# Patient Record
Sex: Female | Born: 1990 | Race: White | Hispanic: No | Marital: Married | Smoking: Never smoker
Health system: Southern US, Community
[De-identification: ages and names within clinical notes are randomized; demographics above are authoritative.]

## PROBLEM LIST (undated history)

## (undated) DIAGNOSIS — G4700 Insomnia, unspecified: Secondary | ICD-10-CM

## (undated) DIAGNOSIS — R7989 Other specified abnormal findings of blood chemistry: Secondary | ICD-10-CM

## (undated) DIAGNOSIS — F411 Generalized anxiety disorder: Secondary | ICD-10-CM

## (undated) DIAGNOSIS — O24419 Gestational diabetes mellitus in pregnancy, unspecified control: Secondary | ICD-10-CM

## (undated) HISTORY — PX: NM RENAL LASIX (ARMC HX): HXRAD1213

## (undated) HISTORY — DX: Insomnia, unspecified: G47.00

## (undated) HISTORY — DX: Generalized anxiety disorder: F41.1

## (undated) HISTORY — PX: TONSILLECTOMY AND ADENOIDECTOMY: SHX28

## (undated) HISTORY — DX: Gestational diabetes mellitus in pregnancy, unspecified control: O24.419

## (undated) HISTORY — DX: Other specified abnormal findings of blood chemistry: R79.89

## (undated) HISTORY — PX: WISDOM TOOTH EXTRACTION: SHX21

---

## 2011-03-27 ENCOUNTER — Ambulatory Visit: Payer: Self-pay | Admitting: Family Medicine

## 2013-05-22 ENCOUNTER — Emergency Department: Payer: Self-pay | Admitting: Internal Medicine

## 2015-01-26 ENCOUNTER — Ambulatory Visit (INDEPENDENT_AMBULATORY_CARE_PROVIDER_SITE_OTHER): Payer: BLUE CROSS/BLUE SHIELD | Admitting: Family Medicine

## 2015-01-26 ENCOUNTER — Encounter: Payer: Self-pay | Admitting: Family Medicine

## 2015-01-26 VITALS — BP 120/81 | HR 90 | Temp 98.3°F | Wt 133.0 lb

## 2015-01-26 DIAGNOSIS — L723 Sebaceous cyst: Secondary | ICD-10-CM

## 2015-01-26 NOTE — Progress Notes (Signed)
   BP 120/81 mmHg  Pulse 90  Temp(Src) 98.3 F (36.8 C)  Wt 133 lb (60.328 kg)  SpO2 99%  LMP 01/16/2015 (Approximate)   Subjective:    Patient ID: Haley Bell, female    DOB: 11/08/1990, 24 y.o.   MRN: [id number]  HPI: Haley Bell is a 24 y.o. female  Chief Complaint  Patient presents with  . Cyst    upper left shoulder, been there over a year   It is this little place over the left shoulder on her back There for over a year Thought it was a stress place where she hurts from stress If you pull it up, it is like a marble She feels like she can squeeze stuff out of it but has not actually squeezed anything out Sometimes it will flare up; if she drives from school to home she can feel it throbbing back there  Relevant past medical, surgical, family and social history reviewed and updated as indicated. Interim medical history since our last visit reviewed. Allergies and medications reviewed and updated.  Review of Systems  Per HPI unless specifically indicated above     Objective:    BP 120/81 mmHg  Pulse 90  Temp(Src) 98.3 F (36.8 C)  Wt 133 lb (60.328 kg)  SpO2 99%  LMP 01/16/2015 (Approximate)  Wt Readings from Last 3 Encounters:  01/26/15 133 lb (60.328 kg)  07/10/13 135 lb (61.236 kg)    Physical Exam  Constitutional: She appears well-developed and well-nourished. No distress.  Eyes: EOM are normal. No scleral icterus.  Cardiovascular: Normal rate.   Pulmonary/Chest: Effort normal.  Abdominal: She exhibits no distension.  Skin: No pallor.  Over the posterior right shoulder / upper back, there is a flesh-colored swelling with central pore; no erythema; discrete margins  Psychiatric: She has a normal mood and affect. Her behavior is normal. Judgment and thought content normal.    No results found for this or any previous visit.    Assessment & Plan:   Problem List Items Addressed This Visit    None    Visit Diagnoses    Sebaceous cyst    -   Primary    explained diagnosis; options for treatment; she opted for I&D; after R/B/A discussed, area prepped, 1 cc lido w/epi, #11 blade, expressed sebaceous material      dressing applied; wound care instructions explained  after numbing, patient felt dizzy and was laid down; recovered well, felt fine at end of procedure  return if needed, but no specific f/u scheduled  Follow up plan: No Follow-up on file.

## 2015-01-26 NOTE — Patient Instructions (Signed)
Keep area covered and clean and dry today Tomorrow you can change the bandage and use neosporin or polysporin Keep it covered with a bandage when you shower for the next several days Please call if you notice any redness, yellow foul-smelling drainage, or run a fever You are welcome to return for ten days to wound checks or if you develop any problems free of charge; just call

## 2015-06-23 ENCOUNTER — Telehealth: Payer: Self-pay

## 2015-06-23 NOTE — Telephone Encounter (Signed)
Patient's mother Rosey Bath(Teresa) would like to know if patient has had a current Tetanus shot.  Please call patient's mother at [phone] to advise.

## 2015-06-27 NOTE — Telephone Encounter (Signed)
done

## 2015-07-20 ENCOUNTER — Ambulatory Visit (INDEPENDENT_AMBULATORY_CARE_PROVIDER_SITE_OTHER): Payer: BLUE CROSS/BLUE SHIELD | Admitting: Family Medicine

## 2015-07-20 ENCOUNTER — Encounter: Payer: Self-pay | Admitting: Family Medicine

## 2015-07-20 VITALS — BP 121/77 | HR 84 | Temp 98.5°F | Ht 66.75 in | Wt 131.0 lb

## 2015-07-20 DIAGNOSIS — Z0000 Encounter for general adult medical examination without abnormal findings: Secondary | ICD-10-CM

## 2015-07-20 DIAGNOSIS — F411 Generalized anxiety disorder: Secondary | ICD-10-CM | POA: Insufficient documentation

## 2015-07-20 DIAGNOSIS — G4700 Insomnia, unspecified: Secondary | ICD-10-CM | POA: Diagnosis not present

## 2015-07-20 HISTORY — DX: Insomnia, unspecified: G47.00

## 2015-07-20 HISTORY — DX: Generalized anxiety disorder: F41.1

## 2015-07-20 MED ORDER — SERTRALINE HCL 50 MG PO TABS: ORAL_TABLET | ORAL | Status: DC

## 2015-07-20 NOTE — Assessment & Plan Note (Signed)
Discussed sx, don't think this is ADHD or primary sleep disorder; believe this is anxiety d/o; will start sertraline; explained risk of developing mania or hypomania, reasons to call before next visit; she denies bipolar d/o in family; list of counselors given from [employer] site, showed her the site; encouraged her to start working with someone

## 2015-07-20 NOTE — Assessment & Plan Note (Signed)
See AVS for sleep recommendations; will have her try melatonin at exact same time of night for 3 weeks; if we can turn down the intrusive worry, I think she'll relax and sleep better; return in 3-4 weeks

## 2015-07-20 NOTE — Progress Notes (Signed)
BP 121/77 mmHg  Pulse 84  Temp(Src) 98.5 F (36.9 C)  Ht 5' 6.75" (1.695 m)  Wt 131 lb (59.421 kg)  BMI 20.68 kg/m2  SpO2 99%  LMP 06/26/2015 (Exact Date)   Subjective:    Patient ID: Haley Bell, female    DOB: 1990/06/28, 25 y.o.   MRN: [id number]  HPI: Haley Bell is a 25 y.o. female  Chief Complaint  Patient presents with  . Insomnia    having trouble falling sleeping at night, very restless   She feels like she has trouble falling asleep She is in an internship right now Not sleeping at night well She feels tired during the day She laid in bed all night and did not sleep Her brain is constantly thinking of everything she has to get down Her thoughts go so fast, can't concentrate on anything else Can't just sit down and watch TV, because she thinks about everything she has to do Anxious about getting stuff down, stomach is in knots or a ball Sometimes she lays in bed and her heart is beating faster She has been looking up things on-line to see if she can change anything Tries to take deep breaths Started drinking cucumber water, but still feels stressed Worries about everything and can't stop Worries about doing her work I asked if it is new; she says it has always been like this to some degree, but getting worse 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 She took a sleeping pill, advil PM, but didn't help her sleep any better She doesn't want to take something if needed Can't concentrate and can't just think about one thing Weight is stable; she does exercise three times a week; little better after she exercises This goes in waves; then might have a day or two that is better She does drink coffee or soda; drinks water and OJ, just sweet tea if out to eat Does not take any decongestants Coffee or something like that makes her heart race, makes it 10x worse No hallucinations No depression She is a happy person, but hard to focus on anything else when she  gets something on her mind; can never be calm; stomach will hurt if she thinks about stuff too much No loose stools No recent labs  Relevant past medical, surgical, family and social history reviewed and updated as indicated History reviewed. No pertinent past medical history.  Past Surgical History  Procedure Laterality Date  . Tonsillectomy and adenoidectomy    . Wisdom tooth extraction     Interim medical history since our last visit reviewed. Family History  Problem Relation Age of Onset  . Ovarian cysts Mother   . Diabetes Maternal Grandmother   . Hypertension Maternal Grandmother   no known anxiety disorder in the family, but mother does get worried about stuff  Allergies and medications reviewed and updated.  Review of Systems Per HPI unless specifically indicated above     Objective:    BP 121/77 mmHg  Pulse 84  Temp(Src) 98.5 F (36.9 C)  Ht 5' 6.75" (1.695 m)  Wt 131 lb (59.421 kg)  BMI 20.68 kg/m2  SpO2 99%  LMP 06/26/2015 (Exact Date)  Wt Readings from Last 3 Encounters:  07/20/15 131 lb (59.421 kg)  01/26/15 133 lb (60.328 kg)  07/10/13 135 lb (61.236 kg)    Physical Exam  Constitutional: She appears well-developed and well-nourished. No distress.  HENT:  Head: Normocephalic and atraumatic.  Eyes:  EOM are normal. No scleral icterus.  Neck: No thyromegaly present.  Cardiovascular: Normal rate, regular rhythm and normal heart sounds.   No murmur heard. Pulmonary/Chest: Effort normal and breath sounds normal. No respiratory distress. She has no wheezes.  Abdominal: Soft. Bowel sounds are normal. She exhibits no distension.  Musculoskeletal: Normal range of motion. She exhibits no edema.  Neurological: She is alert. She exhibits normal muscle tone.  Reflex Scores:      Patellar reflexes are 2+ on the right side and 2+ on the left side. Skin: Skin is warm and dry. She is not diaphoretic. No pallor.  Psychiatric: Judgment and thought content normal. Her  mood appears anxious. Her speech is not tangential. She is hyperactive. She is not agitated, not aggressive, not actively hallucinating and not combative. She does not exhibit a depressed mood.  Talks fast and high energy, but redirectable, not manic She is attentive.   No results found for this or any previous visit.    Assessment & Plan:   Problem List Items Addressed This Visit      Other   Preventative health care    Check yearly labs; complete physical not done      Relevant Orders   Lipid Panel w/o Chol/HDL Ratio   TSH   Comprehensive metabolic panel   GAD (generalized anxiety disorder) - Primary    Discussed sx, don't think this is ADHD or primary sleep disorder; believe this is anxiety d/o; will start sertraline; explained risk of developing mania or hypomania, reasons to call before next visit; she denies bipolar d/o in family; list of counselors given from [employer] site, showed her the site; encouraged her to start working with someone      Insomnia    See AVS for sleep recommendations; will have her try melatonin at exact same time of night for 3 weeks; if we can turn down the intrusive worry, I think she'll relax and sleep better; return in 3-4 weeks         Follow up plan: Return 3-4 weeks, for medication follow-up.  Face-to-face time with patient was more than 25 minutes, >50% time spent counseling and coordination of care  An after-visit summary was printed and given to the patient at check-out.  Please see the patient instructions which may contain other information and recommendations beyond what is mentioned above in the assessment and plan.  Meds ordered this encounter  Medications  . sertraline (ZOLOFT) 50 MG tablet    Sig: Take half of a pill every morning for four days, then one whole pill daily    Dispense:  28 tablet    Refill:  0   Orders Placed This Encounter  Procedures  . Lipid Panel w/o Chol/HDL Ratio  . TSH  . Comprehensive metabolic  panel

## 2015-07-20 NOTE — Assessment & Plan Note (Signed)
Check yearly labs; complete physical not done

## 2015-07-20 NOTE — Patient Instructions (Addendum)
Try melatonin 3 mg at the same time of night for 3 weeks Start the new medicine for anxiety Avoid caffeine Do start to work with a counselor about strategies that will help you deal with your busy life Check out therapists at [facility] Return in 3-4 weeks for follow-up Call sooner if any problems  Insomnia Insomnia is a sleep disorder that makes it difficult to fall asleep or to stay asleep. Insomnia can cause tiredness (fatigue), low energy, difficulty concentrating, mood swings, and poor performance at work or school.  There are three different ways to classify insomnia:  Difficulty falling asleep.  Difficulty staying asleep.  Waking up too early in the morning. Any type of insomnia can be long-term (chronic) or short-term (acute). Both are common. Short-term insomnia usually lasts for three months or less. Chronic insomnia occurs at least three times a week for longer than three months. CAUSES  Insomnia may be caused by another condition, situation, or substance, such as:  Anxiety.  Certain medicines.  Gastroesophageal reflux disease (GERD) or other gastrointestinal conditions.  Asthma or other breathing conditions.  Restless legs syndrome, sleep apnea, or other sleep disorders.  Chronic pain.  Menopause. This may include hot flashes.  Stroke.  Abuse of alcohol, tobacco, or illegal drugs.  Depression.  Caffeine.   Neurological disorders, such as Alzheimer disease.  An overactive thyroid (hyperthyroidism). The cause of insomnia may not be known. RISK FACTORS Risk factors for insomnia include:  Gender. Women are more commonly affected than men.  Age. Insomnia is more common as you get older.  Stress. This may involve your professional or personal life.  Income. Insomnia is more common in people with lower income.  Lack of exercise.   Irregular work schedule or night shifts.  Traveling between different time zones. SIGNS AND SYMPTOMS If you  have insomnia, trouble falling asleep or trouble staying asleep is the main symptom. This may lead to other symptoms, such as:  Feeling fatigued.  Feeling nervous about going to sleep.  Not feeling rested in the morning.  Having trouble concentrating.  Feeling irritable, anxious, or depressed. TREATMENT  Treatment for insomnia depends on the cause. If your insomnia is caused by an underlying condition, treatment will focus on addressing the condition. Treatment may also include:   Medicines to help you sleep.  Counseling or therapy.  Lifestyle adjustments. HOME CARE INSTRUCTIONS   Take medicines only as directed by your health care provider.  Keep regular sleeping and waking hours. Avoid naps.  Keep a sleep diary to help you and your health care provider figure out what could be causing your insomnia. Include:   When you sleep.  When you wake up during the night.  How well you sleep.   How rested you feel the next day.  Any side effects of medicines you are taking.  What you eat and drink.   Make your bedroom a comfortable place where it is easy to fall asleep:  Put up shades or special blackout curtains to block light from outside.  Use a white noise machine to block noise.  Keep the temperature cool.   Exercise regularly as directed by your health care provider. Avoid exercising right before bedtime.  Use relaxation techniques to manage stress. Ask your health care provider to suggest some techniques that may work well for you. These may include:  Breathing exercises.  Routines to release muscle tension.  Visualizing peaceful scenes.  Cut back on alcohol, caffeinated beverages, and cigarettes, especially close  to bedtime. These can disrupt your sleep.  Do not overeat or eat spicy foods right before bedtime. This can lead to digestive discomfort that can make it hard for you to sleep.  Limit screen use before bedtime. This includes:  Watching  TV.  Using your smartphone, tablet, and computer.  Stick to a routine. This can help you fall asleep faster. Try to do a quiet activity, brush your teeth, and go to bed at the same time each night.  Get out of bed if you are still awake after 15 minutes of trying to sleep. Keep the lights down, but try reading or doing a quiet activity. When you feel sleepy, go back to bed.  Make sure that you drive carefully. Avoid driving if you feel very sleepy.  Keep all follow-up appointments as directed by your health care provider. This is important. SEEK MEDICAL CARE IF:   You are tired throughout the day or have trouble in your daily routine due to sleepiness.  You continue to have sleep problems or your sleep problems get worse. SEEK IMMEDIATE MEDICAL CARE IF:   You have serious thoughts about hurting yourself or someone else.   This information is not intended to replace advice given to you by your health care provider. Make sure you discuss any questions you have with your health care provider.   Document Released: 06/01/2000 Document Revised: 02/23/2015 Document Reviewed: 03/05/2014 Elsevier Interactive Patient Education 2016 [employer] Inc[employer] Generalized Anxiety Disorder Generalized anxiety disorder (GAD) is a mental disorder. It interferes with life functions, including relationships, work, and school. GAD is different from normal anxiety, which everyone experiences at some point in their lives in response to specific life events and activities. Normal anxiety actually helps [place] prepare for and get through these life events and activities. Normal anxiety goes away after the event or activity is over.  GAD causes anxiety that is not necessarily related to specific events or activities. It also causes excess anxiety in proportion to specific events or activities. The anxiety associated with GAD is also difficult to control. GAD can vary from mild to severe. People with severe GAD can have intense  waves of anxiety with physical symptoms (panic attacks).  SYMPTOMS The anxiety and worry associated with GAD are difficult to control. This anxiety and worry are related to many life events and activities and also occur more days than not for 6 months or longer. People with GAD also have three or more of the following symptoms (one or more in children):  Restlessness.   Fatigue.  Difficulty concentrating.   Irritability.  Muscle tension.  Difficulty sleeping or unsatisfying sleep. DIAGNOSIS GAD is diagnosed through an assessment by your health care provider. Your health care provider will ask you questions aboutyour mood,physical symptoms, and events in your life. Your health care provider may ask you about your medical history and use of alcohol or drugs, including prescription medicines. Your health care provider may also do a physical exam and blood tests. Certain medical conditions and the use of certain substances can cause symptoms similar to those associated with GAD. Your health care provider may refer you to a mental health specialist for further evaluation. TREATMENT The following therapies are usually used to treat GAD:   Medication. Antidepressant medication usually is prescribed for long-term daily control. Antianxiety medicines may be added in severe cases, especially when panic attacks occur.   Talk therapy (psychotherapy). Certain types of talk therapy can be helpful in treating GAD by providing  support, education, and guidance. A form of talk therapy called cognitive behavioral therapy can teach you healthy ways to think about and react to daily life events and activities.  Stress managementtechniques. These include yoga, meditation, and exercise and can be very helpful when they are practiced regularly. A mental health specialist can help determine which treatment is best for you. Some people see improvement with one therapy. However, other people require a  combination of therapies.   This information is not intended to replace advice given to you by your health care provider. Make sure you discuss any questions you have with your health care provider.   Document Released: 09/29/2012 Document Revised: 06/25/2014 Document Reviewed: 09/29/2012 Elsevier Interactive Patient Education [employer]

## 2015-07-21 LAB — COMPREHENSIVE METABOLIC PANEL
ALK PHOS: 54 IU/L (ref 39–117)
ALT: 10 IU/L (ref 0–32)
AST: 12 IU/L (ref 0–40)
Albumin/Globulin Ratio: 1.7 (ref 1.1–2.5)
Albumin: 4.5 g/dL (ref 3.5–5.5)
BUN/Creatinine Ratio: 12 (ref 8–20)
BUN: 9 mg/dL (ref 6–20)
Bilirubin Total: <0.2 mg/dL (ref 0.0–1.2)
CO2: 23 mmol/L (ref 18–29)
CREATININE: 0.74 mg/dL (ref 0.57–1.00)
Calcium: 9.8 mg/dL (ref 8.7–10.2)
Chloride: 103 mmol/L (ref 96–106)
GFR calc Af Amer: 132 mL/min/{1.73_m2} (ref >59)
GFR calc non Af Amer: 115 mL/min/{1.73_m2} (ref >59)
GLOBULIN, TOTAL: 2.6 g/dL (ref 1.5–4.5)
Glucose: 85 mg/dL (ref 65–99)
Potassium: 4.5 mmol/L (ref 3.5–5.2)
SODIUM: 140 mmol/L (ref 134–144)
Total Protein: 7.1 g/dL (ref 6.0–8.5)

## 2015-07-21 LAB — LIPID PANEL W/O CHOL/HDL RATIO
Cholesterol, Total: 157 mg/dL (ref 100–199)
HDL: 67 mg/dL (ref >39)
LDL CALC: 78 mg/dL (ref 0–99)
Triglycerides: 58 mg/dL (ref 0–149)
VLDL CHOLESTEROL CAL: 12 mg/dL (ref 5–40)

## 2015-07-21 LAB — TSH: TSH: 5.33 u[IU]/mL — AB (ref 0.450–4.500)

## 2015-07-27 ENCOUNTER — Telehealth: Payer: Self-pay | Admitting: Family Medicine

## 2015-07-27 DIAGNOSIS — R7989 Other specified abnormal findings of blood chemistry: Secondary | ICD-10-CM

## 2015-07-27 NOTE — Telephone Encounter (Signed)
TSH a little high; other labs okay; I called, left msg

## 2015-07-28 DIAGNOSIS — R7989 Other specified abnormal findings of blood chemistry: Secondary | ICD-10-CM | POA: Insufficient documentation

## 2015-07-28 HISTORY — DX: Other specified abnormal findings of blood chemistry: R79.89

## 2015-07-28 NOTE — Telephone Encounter (Signed)
Discussed lab results; no problems with s/s of hypothyroidism; no known fam hx of thyroid trouble Recheck in 3 months, but call sooner if issues Other labs look great

## 2015-07-28 NOTE — Telephone Encounter (Signed)
Please call patient on her cell phone [phone]

## 2015-08-11 ENCOUNTER — Ambulatory Visit: Payer: BLUE CROSS/BLUE SHIELD | Admitting: Family Medicine

## 2015-08-24 ENCOUNTER — Other Ambulatory Visit: Payer: Self-pay | Admitting: Family Medicine

## 2015-08-24 NOTE — Telephone Encounter (Signed)
Routing to provider  

## 2015-08-24 NOTE — Telephone Encounter (Signed)
pts mom called and stated that anxiety medication was working well and she would like to have another month supply [facility] [address]

## 2015-08-25 MED ORDER — SERTRALINE HCL 50 MG PO TABS: 50.0000 mg | ORAL_TABLET | Freq: Every day | ORAL | Status: DC

## 2015-08-25 NOTE — Telephone Encounter (Signed)
Patient was supposed to be seen 3-4 weeks after starting this I called patient personally; she says it is working well; her stomach doesn't get tight any more; not worrying as much as night; can just fall asleep No negative side effects, no problems; no mania or hypomania Six month supply approved, Rx sent; call with any problems

## 2016-02-21 ENCOUNTER — Encounter: Payer: Self-pay | Admitting: Family Medicine

## 2016-02-21 ENCOUNTER — Ambulatory Visit (INDEPENDENT_AMBULATORY_CARE_PROVIDER_SITE_OTHER): Payer: BLUE CROSS/BLUE SHIELD | Admitting: Family Medicine

## 2016-02-21 VITALS — BP 110/78 | HR 93 | Temp 98.1°F | Wt 138.0 lb

## 2016-02-21 DIAGNOSIS — R946 Abnormal results of thyroid function studies: Secondary | ICD-10-CM | POA: Diagnosis not present

## 2016-02-21 DIAGNOSIS — R7989 Other specified abnormal findings of blood chemistry: Secondary | ICD-10-CM

## 2016-02-21 DIAGNOSIS — R5383 Other fatigue: Secondary | ICD-10-CM | POA: Diagnosis not present

## 2016-02-21 DIAGNOSIS — F411 Generalized anxiety disorder: Secondary | ICD-10-CM

## 2016-02-21 LAB — CBC WITH DIFFERENTIAL/PLATELET
BASOS ABS: 0 {cells}/uL (ref 0–200)
Basophils Relative: 0 %
Eosinophils Absolute: 132 cells/uL (ref 15–500)
Eosinophils Relative: 2 %
HCT: 35.3 % (ref 35.0–45.0)
HEMOGLOBIN: 11.7 g/dL (ref 11.7–15.5)
Lymphocytes Relative: 32 %
Lymphs Abs: 2112 cells/uL (ref 850–3900)
MCH: 33.1 pg — AB (ref 27.0–33.0)
MCHC: 33.1 g/dL (ref 32.0–36.0)
MCV: 100 fL (ref 80.0–100.0)
MONO ABS: 660 {cells}/uL (ref 200–950)
MPV: 10.7 fL (ref 7.5–12.5)
Monocytes Relative: 10 %
NEUTROS PCT: 56 %
Neutro Abs: 3696 cells/uL (ref 1500–7800)
Platelets: 240 10*3/uL (ref 140–400)
RBC: 3.53 MIL/uL — ABNORMAL LOW (ref 3.80–5.10)
RDW: 12.3 % (ref 11.0–15.0)
WBC: 6.6 10*3/uL (ref 3.8–10.8)

## 2016-02-21 MED ORDER — HYDROXYZINE PAMOATE 25 MG PO CAPS: 25.0000 mg | ORAL_CAPSULE | Freq: Four times a day (QID) | ORAL | 0 refills | Status: DC | PRN

## 2016-02-21 NOTE — Assessment & Plan Note (Signed)
Discussed relaxation response; see after visit summary; encouraged her to try this technique; will have her use hydroxyzine pamoate prn, but do not drive if it makes her sleepy; will likely be best the night before; not addictive; I would rather use this than a benzo

## 2016-02-21 NOTE — Assessment & Plan Note (Signed)
Check CBC and TSH today.  

## 2016-02-21 NOTE — Patient Instructions (Addendum)
Let's get labs   Use the new medicine if needed for anxiety and sleep, but don't take and drive if it makes you sleepy  12 Ways to Curb Anxiety  ?Anxiety is normal human sensation. It is what helped our ancestors survive the pitfalls of the wilderness. Anxiety is defined as experiencing worry or nervousness about an imminent event or something with an uncertain outcome. It is a feeling experienced by most people at some point in their lives. Anxiety can be triggered by a very personal issue, such as the illness of a loved one, or an event of global proportions, such as a refugee crisis. Some of the symptoms of anxiety are:  Feeling restless.  Having a feeling of impending danger.  Increased heart rate.  Rapid breathing. Sweating.  Shaking.  Weakness or feeling tired.  Difficulty concentrating on anything except the current worry.  Insomnia.  Stomach or bowel problems. What can we do about anxiety we may be feeling? There are many techniques to help manage stress and relax. Here are 12 ways you can reduce your anxiety almost immediately: 1. Turn off the constant feed of information. Take a social media sabbatical. Studies have shown that social media directly contributes to social anxiety.  2. Monitor your television viewing habits. Are you watching shows that are also contributing to your anxiety, such as 24-hour news stations? Try watching something else, or better yet, nothing at all. Instead, listen to music, read an inspirational book or practice a hobby. 3. Eat nutritious meals. Also, don't skip meals and keep healthful snacks on hand. Hunger and poor diet contributes to feeling anxious. 4. Sleep. Sleeping on a regular schedule for at least seven to eight hours a night will do wonders for your outlook when you are awake. 5. Exercise. Regular exercise will help rid your body of that anxious energy and help you get more restful sleep. 6. Try deep (diaphragmatic) breathing. Inhale slowly  through your nose for five seconds and exhale through your mouth. 7. Practice acceptance and gratitude. When anxiety hits, accept that there are things out of your control that shouldn't be of immediate concern.  8. Seek out humor. When anxiety strikes, watch a funny video, read jokes or call a friend who makes you laugh. Laughter is healing for our bodies and releases endorphins that are calming. 9. Stay positive. Take the effort to replace negative thoughts with positive ones. Try to see a stressful situation in a positive light. Try to come up with solutions rather than dwelling on the problem. 10. Figure out what triggers your anxiety. Keep a journal and make note of anxious moments and the events surrounding them. This will help you identify triggers you can avoid or even eliminate. 11. Talk to someone. Let a trusted friend, family member or even trained professional know that you are feeling overwhelmed and anxious. Verbalize what you are feeling and why.  12. Volunteer. If your anxiety is triggered by a crisis on a large scale, become an advocate and work to resolve the problem that is causing you unease. Anxiety is often unwelcome and can become overwhelming. If not kept in check, it can become a disorder that could require medical treatment. However, if you take the time to care for yourself and avoid the triggers that make you anxious, you will be able to find moments of relaxation and clarity that make your life much more enjoyable.    Steps to Elicit the Relaxation Response The following is the technique  reprinted with permission from Dr. Billy Fischer book The Relaxation Response pages 162-163 1. Sit quietly in a comfortable position. 2. Close your eyes. 3. Deeply relax all your muscles,  beginning at your feet and progressing up to your face.  Keep them relaxed. 4. Breathe through your nose.  Become aware of your breathing.  As you breathe out, say the word, "one"*,  silently  to yourself. For example,  breathe in ... out, "one",- in .. out, "one", etc.  Breathe easily and naturally. 5. Continue for 10 to 20 minutes.  You may open your eyes to check the time, but do not use an alarm.  When you finish, sit quietly for several minutes,  at first with your eyes closed and later with your eyes opened.  Do not stand up for a few minutes. 6. Do not worry about whether you are successful  in achieving a deep level of relaxation.  Maintain a passive attitude and permit relaxation to occur at its own pace.  When distracting thoughts occur,  try to ignore them by not dwelling upon them  and return to repeating "one."  With practice, the response should come with little effort.  Practice the technique once or twice daily,  but not within two hours after any meal,  since the digestive processes seem to interfere with  the elicitation of the Relaxation Response. * It is better to use a soothing, mellifluous sound, preferably with no meaning. or association, to avoid stimulation of unnecessary thoughts - a mantra.

## 2016-02-21 NOTE — Assessment & Plan Note (Signed)
Check TSH again

## 2016-02-21 NOTE — Progress Notes (Signed)
BP 110/78   Pulse 93   Temp 98.1 F (36.7 C)   Wt 138 lb (62.6 kg)   LMP 02/05/2016   SpO2 98%   BMI 21.78 kg/m    Subjective:    Patient ID: Haley Bell, female    DOB: [birth year], 25 y.o.   MRN: [id number]  HPI: Haley Bell is a 25 y.o. female  Chief Complaint  Patient presents with  . 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 She was wondering is there is any type of anxiety medicine to take as needed Doesn't really want to take something every day; she won't be able to sleep Three weeks straight  Once she gets up there and teaching she'll be fine; it's the mental thing the night before; lays in bed the night before worrying about everything she has to do; gets nervous about the video, would rather have the principal sit in than be video taped Once she is up there and teaching she is okay Not having pounding in the chest, no rapid heartbeat No dry mouth; no fear of crowds She does have trouble falling asleep the night before; she had not had trouble lately, knows how she used to get; would get no sleep last semester when this happened She tried going to bed earlier; a roommate was in nursing school and talked with her about breathing exercises; she tries to not look at the phone or TV before going to the bed  She had labs checked in February; TSH was mildly abnormal at 5.330 She is interested in rechecking labs; gained just a few pounds; constipated; tries eating better, still constipated; no dry skin, no hair changes; feeling some fatigue during the day; taking chewable vitamins  Depression screen PHQ 2/9 02/21/2016  Decreased Interest 0  Down, Depressed, Hopeless 0  PHQ - 2  Score 0   Relevant past medical, surgical, family and social history reviewed No past medical history on file.  MD note: abnormal thyroid test; OCP use  Past Surgical History:  Procedure Laterality Date  . TONSILLECTOMY AND ADENOIDECTOMY    . WISDOM TOOTH EXTRACTION     Family History  Problem Relation Age of Onset  . Ovarian cysts Mother   . Diabetes Maternal Grandmother   . Hypertension Maternal Grandmother   MD note: grandmother has thyroid disease  Social History  Substance Use Topics  . Smoking status: Never Smoker  . Smokeless tobacco: Never Used  . Alcohol use No   Interim medical history since last visit reviewed. Allergies and medications reviewed  Review of Systems Per HPI unless specifically indicated above     Objective:    BP 110/78   Pulse 93   Temp 98.1 F (36.7 C)   Wt 138 lb (62.6 kg)   LMP 02/05/2016   SpO2 98%   BMI 21.78 kg/m   Wt Readings from Last 3 Encounters:  02/21/16 138 lb (62.6 kg)  07/20/15 131 lb (59.4 kg)  01/26/15 133 lb (60.3 kg)    Physical Exam  Constitutional: She appears well-developed and well-nourished. No distress.  Weight gain 7 pounds over last 7 months  Eyes: EOM are normal. No scleral icterus.  Neck: No thyromegaly present.  Cardiovascular: Normal rate and regular rhythm.   Pulmonary/Chest: Effort normal and breath sounds normal.  Neurological: She is alert.  Reflex Scores:      Patellar reflexes are 1+ on the right side and 1+ on the left side. Skin: No pallor.  Psychiatric: She has a normal mood and affect. Her mood appears not anxious.  Very polite and cooperative   Results for orders placed or performed in visit on 07/20/15  Lipid Panel w/o Chol/HDL Ratio  Result Value Ref Range   Cholesterol, Total 157 100 - 199 mg/dL   Triglycerides 58 0 - 149 mg/dL   HDL 67 >13 mg/dL   VLDL Cholesterol Cal 12 5 - 40 mg/dL   LDL Calculated 78 0 - 99 mg/dL  TSH  Result Value Ref Range   TSH 5.330 (H) 0.450 - 4.500  uIU/mL  Comprehensive metabolic panel  Result Value Ref Range   Glucose 85 65 - 99 mg/dL   BUN 9 6 - 20 mg/dL   Creatinine, Ser 2.44 0.57 - 1.00 mg/dL   GFR calc non Af Amer 115 >59 mL/min/1.73   GFR calc Af Amer 132 >59 mL/min/1.73   BUN/Creatinine Ratio 12 8 - 20   Sodium 140 134 - 144 mmol/L   Potassium 4.5 3.5 - 5.2 mmol/L   Chloride 103 96 - 106 mmol/L   CO2 23 18 - 29 mmol/L   Calcium 9.8 8.7 - 10.2 mg/dL   Total Protein 7.1 6.0 - 8.5 g/dL   Albumin 4.5 3.5 - 5.5 g/dL   Globulin, Total 2.6 1.5 - 4.5 g/dL   Albumin/Globulin Ratio 1.7 1.1 - 2.5   Bilirubin Total <0.2 0.0 - 1.2 mg/dL   Alkaline Phosphatase 54 39 - 117 IU/L   AST 12 0 - 40 IU/L   ALT 10 0 - 32 IU/L      Assessment & Plan:   Problem List Items Addressed This Visit      Other   GAD (generalized anxiety disorder) - Primary    Discussed relaxation response; see after visit summary; encouraged her to try this technique; will have her use hydroxyzine pamoate prn, but do not drive if it makes her sleepy; will likely be best the night before; not addictive; I would rather use this than a benzo      Fatigue    Check CBC and TSH today      Relevant Orders   CBC with Differential/Platelet   TSH   Abnormal thyroid blood test    Check TSH again      Relevant Orders   TSH    Other Visit Diagnoses   None.      Follow up plan: Return if symptoms worsen or fail to improve.  An after-visit summary was printed and given to the patient at check-out.  Please see the patient instructions which may contain other information and recommendations beyond what is mentioned above in the assessment and plan.  Meds ordered this encounter  Medications  . hydrOXYzine (VISTARIL) 25 MG capsule    Sig: Take 1 capsule (25 mg total) by mouth every 6 (six) hours as needed for anxiety.    Dispense:  30 capsule    Refill:  0    Orders Placed This Encounter  Procedures  . CBC with Differential/Platelet  . TSH

## 2016-02-22 LAB — TSH: TSH: 2.97 mIU/L

## 2016-08-16 ENCOUNTER — Ambulatory Visit: Payer: BLUE CROSS/BLUE SHIELD | Admitting: Family Medicine

## 2016-10-16 ENCOUNTER — Encounter: Payer: Self-pay | Admitting: Family Medicine

## 2016-10-16 ENCOUNTER — Ambulatory Visit (INDEPENDENT_AMBULATORY_CARE_PROVIDER_SITE_OTHER): Payer: BLUE CROSS/BLUE SHIELD | Admitting: Family Medicine

## 2016-10-16 VITALS — BP 110/64 | HR 90 | Temp 97.3°F | Resp 14 | Wt 132.0 lb

## 2016-10-16 DIAGNOSIS — H1032 Unspecified acute conjunctivitis, left eye: Secondary | ICD-10-CM

## 2016-10-16 DIAGNOSIS — R0981 Nasal congestion: Secondary | ICD-10-CM | POA: Diagnosis not present

## 2016-10-16 DIAGNOSIS — J069 Acute upper respiratory infection, unspecified: Secondary | ICD-10-CM

## 2016-10-16 MED ORDER — PREDNISONE 20 MG PO TABS: 40.0000 mg | ORAL_TABLET | Freq: Every day | ORAL | 0 refills | Status: AC

## 2016-10-16 MED ORDER — SULFACETAMIDE SODIUM 10 % OP SOLN: 1.0000 [drp] | OPHTHALMIC | 0 refills | Status: DC

## 2016-10-16 NOTE — Progress Notes (Signed)
BP 110/64   Pulse 90   Temp 97.3 F (36.3 C) (Oral)   Resp 14   Wt 132 lb (59.9 kg)   LMP 10/11/2016   SpO2 99%   BMI 20.83 kg/m    Subjective:    Patient ID: Haley Bell, female    DOB: January 29, 1991, 26 y.o.   MRN: [id number]  HPI: Haley Bell is a 26 y.o. female  Chief Complaint  Patient presents with  . URI    nasal congestion, cough, sore throat, left eye pink and green phlem  . Conjunctivitis    left, crusted shut this am, red and watery   Patient is here for an acute visit HPI Nose really congested, can't breathe through her nose Throat really hurts when she swallows; that started 3 days ago Cough started yesterday Using nasal spray; she has tried everything; dayquil, claritin, mucinex, sudafed, flonase, other nose spray Father is really bothered with allergies  Left eye was swollen closed this morning; closed shut; scraped it off gently in the shower and now eye is watery and bothers her; she does have an eye doctor; not wearing contacts today; wearing glasses Mucous in her throat, drips down, bright green coming out of her throat  Depression screen [facility] 2/9 10/16/2016 02/21/2016  Decreased Interest 0 0  Down, Depressed, Hopeless 0 0  PHQ - 2 Score 0 0    Relevant past medical, surgical, family and social history reviewed No past medical history on file.  MD note: OCP use  Past Surgical History:  Procedure Laterality Date  . TONSILLECTOMY AND ADENOIDECTOMY    . WISDOM TOOTH EXTRACTION     Family History  Problem Relation Age of Onset  . Ovarian cysts Mother   . Diabetes Maternal Grandmother   . Hypertension Maternal Grandmother    Social History  Substance Use Topics  . Smoking status: Never Smoker  . Smokeless tobacco: Never Used  . Alcohol use No   Interim medical history since last visit reviewed. Allergies and medications reviewed  Review of Systems Per HPI unless specifically indicated above     Objective:    BP 110/64   Pulse 90    Temp 97.3 F (36.3 C) (Oral)   Resp 14   Wt 132 lb (59.9 kg)   LMP 10/11/2016   SpO2 99%   BMI 20.83 kg/m   Wt Readings from Last 3 Encounters:  10/16/16 132 lb (59.9 kg)  02/21/16 138 lb (62.6 kg)  07/20/15 131 lb (59.4 kg)    Physical Exam  Constitutional: She appears well-developed and well-nourished.  HENT:  Right Ear: Tympanic membrane, external ear and ear canal normal. No drainage. Tympanic membrane is not erythematous. No middle ear effusion.  Left Ear: Tympanic membrane, external ear and ear canal normal. No drainage. Tympanic membrane is not erythematous.  No middle ear effusion.  Nose: Mucosal edema and rhinorrhea present.  Mouth/Throat: Oropharynx is clear and moist and mucous membranes are normal. No oropharyngeal exudate, posterior oropharyngeal edema or posterior oropharyngeal erythema.  Eyes: EOM are normal. Right eye exhibits no discharge, no exudate and no hordeolum. Left eye exhibits no discharge, no exudate and no hordeolum. Right conjunctiva is not injected. Right conjunctiva has no hemorrhage. Left conjunctiva is injected. Left conjunctiva has no hemorrhage. No scleral icterus.  Cardiovascular: Normal rate and regular rhythm.   Pulmonary/Chest: Effort normal and breath sounds normal.  Lymphadenopathy:    She has no cervical adenopathy.  Skin: She is not diaphoretic.  No pallor.  Psychiatric: She has a normal mood and affect. Her behavior is normal. She does not exhibit a depressed mood.      Assessment & Plan:   Problem List Items Addressed This Visit    None    Visit Diagnoses    Acute bacterial conjunctivitis of left eye    -  Primary   contagious; start eye drops; if right eye b/c affected, use there too; if not improving, go see eye doctor in case of corneal abrasion, do not rub eyes   Nasal congestion       may continue oral antihistamine, nasal corticosteroid; will put on five day course of prednisone given severit of symptoms, in case allergies  playing a part   Viral upper respiratory tract infection       I do not think antibiotics are warranted; symptomatic care      Follow up plan: No Follow-up on file.  An after-visit summary was printed and given to the patient at check-out.  Please see the patient instructions which may contain other information and recommendations beyond what is mentioned above in the assessment and plan.  Meds ordered this encounter  Medications  . predniSONE (DELTASONE) 20 MG tablet    Sig: Take 2 tablets (40 mg total) by mouth daily with breakfast.    Dispense:  10 tablet    Refill:  0  . sulfacetamide (BLEPH-10) 10 % ophthalmic solution    Sig: Place 1 drop into the left eye every 3 (three) hours. (while awake)    Dispense:  10 mL    Refill:  0   No orders of the defined types were placed in this encounter.

## 2016-10-16 NOTE — Patient Instructions (Signed)
Try plain claritin or allegra or zyrtec, once a day Start the prednisone, take with food Start the eye drops; use in the left eye first If the right eye becomes involved, use it there too If left eye not getting better tomorrow, do see your eye doctor Do not rub that eye Try vitamin C (orange juice if not diabetic or vitamin C tablets) and drink green tea to help your immune system during your illness Get plenty of rest and hydration

## 2016-10-25 ENCOUNTER — Ambulatory Visit: Payer: BLUE CROSS/BLUE SHIELD | Admitting: Family Medicine

## 2016-11-02 ENCOUNTER — Ambulatory Visit (INDEPENDENT_AMBULATORY_CARE_PROVIDER_SITE_OTHER): Payer: BLUE CROSS/BLUE SHIELD | Admitting: Family Medicine

## 2016-11-02 ENCOUNTER — Encounter: Payer: Self-pay | Admitting: Family Medicine

## 2016-11-02 VITALS — BP 102/62 | HR 92 | Temp 98.3°F | Resp 14 | Wt 132.5 lb

## 2016-11-02 DIAGNOSIS — B079 Viral wart, unspecified: Secondary | ICD-10-CM

## 2016-11-02 NOTE — Patient Instructions (Addendum)
I'll recommend Duofilm Let me know if freezing or referral to dermatologist desired   Warts Warts are small growths on the skin. They are common and can occur on various areas of the body. A person may have one wart or multiple warts. Most warts are not painful, and they usually do not cause problems. However, warts can cause pain if they are large or occur in an area of the body where pressure will be applied to them, such as the bottom of the foot. In many cases, warts do not require treatment. They usually go away on their own over a period of many months to a couple years. Various treatments may be done for warts that cause problems or do not go away. Sometimes, warts go away and then come back again. What are the causes? Warts are caused by a type of virus that is called human papillomavirus (HPV). This virus can spread from person to person through direct contact. Warts can also spread to other areas of the body when a person scratches a wart and then scratches another area of his or her body. What increases the risk? Warts are more likely to develop in:  People who are 32-23 years of age.  People who have a weakened body defense system (immune system). What are the signs or symptoms? A wart may be round or oval or have an irregular shape. Most warts have a rough surface. Warts may range in color from skin color to light yellow, brown, or gray. They are generally less than  inch (1.3 cm) in size. Most warts are painless, but some can be painful when pressure is applied to them. How is this diagnosed? A wart can usually be diagnosed from its appearance. In some cases, a tissue sample may be removed (biopsy) to be looked at under a microscope. How is this treated? In many cases, warts do not need treatment. If treatment is needed, options may include:  Applying medicated solutions, creams, or patches to the wart. These may be over-the-counter or prescription medicines that make the skin  soft so that layers will gradually shed away. In many cases, the medicine is applied one or two times per day and covered with a bandage.  Putting duct tape over the top of the wart (occlusion). You will leave the tape in place for as long as told by your health care provider, then you will replace it with a new strip of tape. This is done until the wart goes away.  Freezing the wart with liquid nitrogen (cryotherapy).  Burning the wart with:  Laser treatment.  An electrified probe (electrocautery).  Injection of a medicine (Candida antigen) into the wart to help the body's immune system to fight off the wart.  Surgery to remove the wart. Follow these instructions at home:  Apply over-the-counter and prescription medicines only as told by your health care provider.  Do not apply over-the-counter wart medicines to your face or genitals before you ask your health care provider if it is okay to do so.  Do not scratch or pick at a wart.  Wash your hands after you touch a wart.  Avoid shaving hair that is over a wart.  Keep all follow-up visits as told by your health care provider. This is important. Contact a health care provider if:  Your warts do not improve after treatment.  You have redness, swelling, or pain at the site of a wart.  You have bleeding from a wart that does  not stop with light pressure.  You have diabetes and you develop a wart. This information is not intended to replace advice given to you by your health care provider. Make sure you discuss any questions you have with your health care provider. Document Released: 03/14/2005 Document Revised: 11/16/2015 Document Reviewed: 08/30/2014 Elsevier Interactive Patient Education  2017 [employer]

## 2016-11-02 NOTE — Progress Notes (Signed)
BP 102/62   Pulse 92   Temp 98.3 F (36.8 C) (Oral)   Resp 14   Wt 132 lb 8 oz (60.1 kg)   LMP 10/11/2016   SpO2 98%   BMI 20.91 kg/m    Subjective:    Patient ID: Haley Bell, female    DOB: 08/30/1990, 26 y.o.   MRN: [id number]  HPI: Haley Bell is a 26 y.o. female  Chief Complaint  Patient presents with  . Hand Pain    Left pointer has a big wart for the past 6- 7 months. Use to be black in the middle, not anymore now its sore.    HPI Patient is here for evaluation of a wart on the LEFT index finger; it has been there for 6-7 months, just getting larger; she is finally ready to do something about it; she thinks she is getting another one now on the middle finger on the LEFT hand; she has not seen any on her hands elsewhere, or on her knees or feet  [facility] 11/02/2016 10/16/2016 02/21/2016  Decreased Interest 0 0 0  Down, Depressed, Hopeless 0 0 0  PHQ - 2 Score 0 0 0   Relevant past medical, surgical, family and social history reviewed Past Medical History:  Diagnosis Date  . Abnormal thyroid blood test 07/28/2015  . GAD (generalized anxiety disorder) 07/20/2015  . Insomnia 07/20/2015     Past Surgical History:  Procedure Laterality Date  . TONSILLECTOMY AND ADENOIDECTOMY    . WISDOM TOOTH EXTRACTION     Social History   Social History  . Marital status: Single    Spouse name: N/A  . Number of children: N/A  . Years of education: N/A   Occupational History  . Not on file.   Social History Main Topics  . Smoking status: Never Smoker  . Smokeless tobacco: Never Used  . Alcohol use No  . Drug use: No  . Sexual activity: Not on file   Other Topics Concern  . Not on file   Social History Narrative  . No narrative on file   Interim medical history since last visit reviewed. Allergies and medications reviewed  Review of Systems Per HPI unless specifically indicated above     Objective:    BP 102/62   Pulse 92   Temp 98.3 F  (36.8 C) (Oral)   Resp 14   Wt 132 lb 8 oz (60.1 kg)   LMP 10/11/2016   SpO2 98%   BMI 20.91 kg/m   Wt Readings from Last 3 Encounters:  11/02/16 132 lb 8 oz (60.1 kg)  10/16/16 132 lb (59.9 kg)  02/21/16 138 lb (62.6 kg)    Physical Exam  Constitutional: She appears well-developed and well-nourished.  Musculoskeletal:       Hands: Large verrucous papule on the LEFT index finger; smaller verrucous papule at base of cuticle on LEFT middle finger  Skin:  Two verrucous papules (one each on index and middle fingers) LEFT hand; index finger wart is the largest; no other lesions seen on the hands, legs, feet  Psychiatric: Her mood appears not anxious.       Assessment & Plan:   Problem List Items Addressed This Visit    None    Visit Diagnoses    Viral warts, unspecified type    -  Primary   explained dx, mechanism of spread, treatment options; believe duofilm will be her best option given location; call  for referral to derm or return for LN2      Follow up plan: No Follow-up on file.  An after-visit summary was printed and given to the patient at check-out.  Please see the patient instructions which may contain other information and recommendations beyond what is mentioned above in the assessment and plan.  Meds ordered this encounter  Medications  . vitamin B-12 (CYANOCOBALAMIN) 100 MCG tablet    Sig: Take 100 mcg by mouth daily.  . Multiple Vitamin (MULTIVITAMIN) tablet    Sig: Take 1 tablet by mouth daily.  . ferrous sulfate 325 (65 FE) MG EC tablet    Sig: Take 325 mg by mouth 3 (three) times daily with meals.  . folic acid (FOLVITE) 1 MG tablet    Sig: Take 1 mg by mouth daily.    No orders of the defined types were placed in this encounter.

## 2017-01-01 ENCOUNTER — Telehealth: Payer: Self-pay | Admitting: Family Medicine

## 2017-01-01 MED ORDER — HYDROXYZINE PAMOATE 25 MG PO CAPS: 25.0000 mg | ORAL_CAPSULE | Freq: Four times a day (QID) | ORAL | 2 refills | Status: DC | PRN

## 2017-01-01 NOTE — Telephone Encounter (Signed)
That's fine Rx sent as requested

## 2017-01-01 NOTE — Telephone Encounter (Signed)
Left voice message informing patient.

## 2017-01-01 NOTE — Telephone Encounter (Signed)
Pt requesting refill hydroxyzine pamoate 25mg  (generic for vistaril). Please send to [facility]. Was last told that if she needed any more refills to just call the office and Dr Sherie DonLada would refill it. [phone]

## 2017-01-08 ENCOUNTER — Telehealth: Payer: Self-pay | Admitting: Obstetrics and Gynecology

## 2017-01-08 ENCOUNTER — Other Ambulatory Visit: Payer: Self-pay | Admitting: Obstetrics and Gynecology

## 2017-01-08 MED ORDER — TRI-SPRINTEC 0.18/0.215/0.25 MG-35 MCG PO TABS: 1.0000 | ORAL_TABLET | Freq: Every day | ORAL | 1 refills | Status: DC

## 2017-01-08 NOTE — Telephone Encounter (Signed)
Left detailed msg rx eRx'd.

## 2017-01-08 NOTE — Telephone Encounter (Signed)
Pt is requesting a refill on her birthcontrol medication [facility] [address]. Tri- Sprintec (20) 0.18/0.215/0.25mg -35. Pt is schedule 03/06/17 with Helmut MusterAlicia Copland. Pt is leaving tomorrow for trip. Please call patient to notified prescription was sent

## 2017-01-08 NOTE — Telephone Encounter (Signed)
Pls let pt know Rx eRxd. Thx.

## 2017-01-15 ENCOUNTER — Ambulatory Visit (INDEPENDENT_AMBULATORY_CARE_PROVIDER_SITE_OTHER): Payer: BLUE CROSS/BLUE SHIELD | Admitting: Family Medicine

## 2017-01-15 ENCOUNTER — Encounter: Payer: Self-pay | Admitting: Family Medicine

## 2017-01-15 VITALS — BP 110/62 | HR 82 | Temp 98.3°F | Resp 14 | Ht 65.63 in | Wt 130.1 lb

## 2017-01-15 DIAGNOSIS — Z111 Encounter for screening for respiratory tuberculosis: Secondary | ICD-10-CM

## 2017-01-15 DIAGNOSIS — Z3041 Encounter for surveillance of contraceptive pills: Secondary | ICD-10-CM | POA: Diagnosis not present

## 2017-01-15 DIAGNOSIS — Z113 Encounter for screening for infections with a predominantly sexual mode of transmission: Secondary | ICD-10-CM

## 2017-01-15 DIAGNOSIS — Z0000 Encounter for general adult medical examination without abnormal findings: Secondary | ICD-10-CM

## 2017-01-15 DIAGNOSIS — Z0184 Encounter for antibody response examination: Secondary | ICD-10-CM | POA: Diagnosis not present

## 2017-01-15 MED ORDER — TRI-SPRINTEC 0.18/0.215/0.25 MG-35 MCG PO TABS: 1.0000 | ORAL_TABLET | Freq: Every day | ORAL | 12 refills | Status: DC

## 2017-01-15 NOTE — Patient Instructions (Addendum)
We'll get labs today We'll fill out your form when we get the results back  Health Maintenance, Female Adopting a healthy lifestyle and getting preventive care can go a long way to promote health and wellness. Talk with your health care provider about what schedule of regular examinations is right for you. This is a good chance for you to check in with your provider about disease prevention and staying healthy. In between checkups, there are plenty of things you can do on your own. Experts have done a lot of research about which lifestyle changes and preventive measures are most likely to keep you healthy. Ask your health care provider for more information. Weight and diet Eat a healthy diet  Be sure to include plenty of vegetables, fruits, low-fat dairy products, and lean protein.  Do not eat a lot of foods high in solid fats, added sugars, or salt.  Get regular exercise. This is one of the most important things you can do for your health. ? Most adults should exercise for at least 150 minutes each week. The exercise should increase your heart rate and make you sweat (moderate-intensity exercise). ? Most adults should also do strengthening exercises at least twice a week. This is in addition to the moderate-intensity exercise.  Maintain a healthy weight  Body mass index (BMI) is a measurement that can be used to identify possible weight problems. It estimates body fat based on height and weight. Your health care provider can help determine your BMI and help you achieve or maintain a healthy weight.  For females 25 years of age and older: ? A BMI below 18.5 is considered underweight. ? A BMI of 18.5 to 24.9 is normal. ? A BMI of 25 to 29.9 is considered overweight. ? A BMI of 30 and above is considered obese.  Watch levels of cholesterol and blood lipids  You should start having your blood tested for lipids and cholesterol at 25 years of age, then have this test every 5 years.  You may  need to have your cholesterol levels checked more often if: ? Your lipid or cholesterol levels are high. ? You are older than 25 years of age. ? You are at high risk for heart disease.  Cancer screening Lung Cancer  Lung cancer screening is recommended for adults 30-54 years old who are at high risk for lung cancer because of a history of smoking.  A yearly low-dose CT scan of the lungs is recommended for people who: ? Currently smoke. ? Have quit within the past 15 years. ? Have at least a 30-pack-year history of smoking. A pack year is smoking an average of one pack of cigarettes a day for 1 year.  Yearly screening should continue until it has been 15 years since you quit.  Yearly screening should stop if you develop a health problem that would prevent you from having lung cancer treatment.  Breast Cancer  Practice breast self-awareness. This means understanding how your breasts normally appear and feel.  It also means doing regular breast self-exams. Let your health care provider know about any changes, no matter how small.  If you are in your 20s or 30s, you should have a clinical breast exam (CBE) by a health care provider every 1-3 years as part of a regular health exam.  If you are 23 or older, have a CBE every year. Also consider having a breast X-ray (mammogram) every year.  If you have a family history of breast cancer,  talk to your health care provider about genetic screening.  If you are at high risk for breast cancer, talk to your health care provider about having an MRI and a mammogram every year.  Breast cancer gene (BRCA) assessment is recommended for women who have family members with BRCA-related cancers. BRCA-related cancers include: ? Breast. ? Ovarian. ? Tubal. ? Peritoneal cancers.  Results of the assessment will determine the need for genetic counseling and BRCA1 and BRCA2 testing.  Cervical Cancer Your health care provider may recommend that you be  screened regularly for cancer of the pelvic organs (ovaries, uterus, and vagina). This screening involves a pelvic examination, including checking for microscopic changes to the surface of your cervix (Pap test). You may be encouraged to have this screening done every 3 years, beginning at age 21.  For women ages 29-65, health care providers may recommend pelvic exams and Pap testing every 3 years, or they may recommend the Pap and pelvic exam, combined with testing for human papilloma virus (HPV), every 5 years. Some types of HPV increase your risk of cervical cancer. Testing for HPV may also be done on women of any age with unclear Pap test results.  Other health care providers may not recommend any screening for nonpregnant women who are considered low risk for pelvic cancer and who do not have symptoms. Ask your health care provider if a screening pelvic exam is right for you.  If you have had past treatment for cervical cancer or a condition that could lead to cancer, you need Pap tests and screening for cancer for at least 20 years after your treatment. If Pap tests have been discontinued, your risk factors (such as having a new sexual partner) need to be reassessed to determine if screening should resume. Some women have medical problems that increase the chance of getting cervical cancer. In these cases, your health care provider may recommend more frequent screening and Pap tests.  Colorectal Cancer  This type of cancer can be detected and often prevented.  Routine colorectal cancer screening usually begins at 25 years of age and continues through 25 years of age.  Your health care provider may recommend screening at an earlier age if you have risk factors for colon cancer.  Your health care provider may also recommend using home test kits to check for hidden blood in the stool.  A small camera at the end of a tube can be used to examine your colon directly (sigmoidoscopy or colonoscopy).  This is done to check for the earliest forms of colorectal cancer.  Routine screening usually begins at age 92.  Direct examination of the colon should be repeated every 5-10 years through 25 years of age. However, you may need to be screened more often if early forms of precancerous polyps or small growths are found.  Skin Cancer  Check your skin from head to toe regularly.  Tell your health care provider about any new moles or changes in moles, especially if there is a change in a mole's shape or color.  Also tell your health care provider if you have a mole that is larger than the size of a pencil eraser.  Always use sunscreen. Apply sunscreen liberally and repeatedly throughout the day.  Protect yourself by wearing long sleeves, pants, a wide-brimmed hat, and sunglasses whenever you are outside.  Heart disease, diabetes, and high blood pressure  High blood pressure causes heart disease and increases the risk of stroke. High blood pressure is  more likely to develop in: ? People who have blood pressure in the high end of the normal range (130-139/85-89 mm Hg). ? People who are overweight or obese. ? People who are African American.  If you are 47-80 years of age, have your blood pressure checked every 3-5 years. If you are 94 years of age or older, have your blood pressure checked every year. You should have your blood pressure measured twice-once when you are at a hospital or clinic, and once when you are not at a hospital or clinic. Record the average of the two measurements. To check your blood pressure when you are not at a hospital or clinic, you can use: ? An automated blood pressure machine at a pharmacy. ? A home blood pressure monitor.  If you are between 34 years and 10 years old, ask your health care provider if you should take aspirin to prevent strokes.  Have regular diabetes screenings. This involves taking a blood sample to check your fasting blood sugar level. ? If  you are at a normal weight and have a low risk for diabetes, have this test once every three years after 25 years of age. ? If you are overweight and have a high risk for diabetes, consider being tested at a younger age or more often. Preventing infection Hepatitis B  If you have a higher risk for hepatitis B, you should be screened for this virus. You are considered at high risk for hepatitis B if: ? You were born in a country where hepatitis B is common. Ask your health care provider which countries are considered high risk. ? Your parents were born in a high-risk country, and you have not been immunized against hepatitis B (hepatitis B vaccine). ? You have HIV or AIDS. ? You use needles to inject street drugs. ? You live with someone who has hepatitis B. ? You have had sex with someone who has hepatitis B. ? You get hemodialysis treatment. ? You take certain medicines for conditions, including cancer, organ transplantation, and autoimmune conditions.  Hepatitis C  Blood testing is recommended for: ? Everyone born from [birth year] through 1964. ? Anyone with known risk factors for hepatitis C.  Sexually transmitted infections (STIs)  You should be screened for sexually transmitted infections (STIs) including gonorrhea and chlamydia if: ? You are sexually active and are younger than 25 years of age. ? You are older than 25 years of age and your health care provider tells you that you are at risk for this type of infection. ? Your sexual activity has changed since you were last screened and you are at an increased risk for chlamydia or gonorrhea. Ask your health care provider if you are at risk.  If you do not have HIV, but are at risk, it may be recommended that you take a prescription medicine daily to prevent HIV infection. This is called pre-exposure prophylaxis (PrEP). You are considered at risk if: ? You are sexually active and do not regularly use condoms or know the HIV status of your  partner(s). ? You take drugs by injection. ? You are sexually active with a partner who has HIV.  Talk with your health care provider about whether you are at high risk of being infected with HIV. If you choose to begin PrEP, you should first be tested for HIV. You should then be tested every 3 months for as long as you are taking PrEP. Pregnancy  If you are premenopausal and you  may become pregnant, ask your health care provider about preconception counseling.  If you may become pregnant, take 400 to 800 micrograms (mcg) of folic acid every day.  If you want to prevent pregnancy, talk to your health care provider about birth control (contraception). Osteoporosis and menopause  Osteoporosis is a disease in which the bones lose minerals and strength with aging. This can result in serious bone fractures. Your risk for osteoporosis can be identified using a bone density scan.  If you are 59 years of age or older, or if you are at risk for osteoporosis and fractures, ask your health care provider if you should be screened.  Ask your health care provider whether you should take a calcium or vitamin D supplement to lower your risk for osteoporosis.  Menopause may have certain physical symptoms and risks.  Hormone replacement therapy may reduce some of these symptoms and risks. Talk to your health care provider about whether hormone replacement therapy is right for you. Follow these instructions at home:  Schedule regular health, dental, and eye exams.  Stay current with your immunizations.  Do not use any tobacco products including cigarettes, chewing tobacco, or electronic cigarettes.  If you are pregnant, do not drink alcohol.  If you are breastfeeding, limit how much and how often you drink alcohol.  Limit alcohol intake to no more than 1 drink per day for nonpregnant women. One drink equals 12 ounces of beer, 5 ounces of wine, or 1 ounces of hard liquor.  Do not use street  drugs.  Do not share needles.  Ask your health care provider for help if you need support or information about quitting drugs.  Tell your health care provider if you often feel depressed.  Tell your health care provider if you have ever been abused or do not feel safe at home. This information is not intended to replace advice given to you by your health care provider. Make sure you discuss any questions you have with your health care provider. Document Released: 12/18/2010 Document Revised: 11/10/2015 Document Reviewed: 03/08/2015 Elsevier Interactive Patient Education  [employer].

## 2017-01-15 NOTE — Assessment & Plan Note (Signed)
USPSTF grade A and B recommendations reviewed with patient; age-appropriate recommendations, preventive care, screening tests, etc discussed and encouraged; healthy living encouraged; see AVS for patient education given to patient; GYN components UTD, pap smear July 2017; next due 2020

## 2017-01-15 NOTE — Assessment & Plan Note (Signed)
She has been doing well with current OCP Rx; no migraine with aura, no hx of DVT, nonsmoker, etc; will refill OCPS

## 2017-01-15 NOTE — Progress Notes (Signed)
Patient ID: Haley Bell, female   DOB: 03/26/1991, 25 y.o.   MRN: [id number]   Subjective:   Haley Bell is a 25 y.o. female here for a complete physical exam  Interim issues since last visit: no medical excitement  USPSTF grade A and B recommendations Depression:  Depression screen PHQ 2/9 01/15/2017 11/02/2016 10/16/2016 02/21/2016  Decreased Interest 0 0 0 0  Down, Depressed, Hopeless 0 0 0 0  PHQ - 2 Score 0 0 0 0   Hypertension: BP Readings from Last 3 Encounters:  01/15/17 110/62  11/02/16 102/62  10/16/16 110/64   Obesity: Wt Readings from Last 3 Encounters:  01/15/17 130 lb 1.6 oz (59 kg)  11/02/16 132 lb 8 oz (60.1 kg)  10/16/16 132 lb (59.9 kg)   BMI Readings from Last 3 Encounters:  01/15/17 21.24 kg/m  11/02/16 20.91 kg/m  10/16/16 20.83 kg/m    Alcohol: no Tobacco use: never HIV, hep B, hep C: declined STD testing and prevention (chl/gon/syphilis): declined Intimate partner violence: no abuse Breast cancer: no breast lumps BRCA gene screening: no breast or ovarian cancer Cervical cancer screening: last July 2017 Osteoporosis: n/a Fall prevention/vitamin D: gets out in the sun Lipids:  Lab Results  Component Value Date   CHOL 157 07/20/2015   Lab Results  Component Value Date   HDL 67 07/20/2015   Lab Results  Component Value Date   LDLCALC 78 07/20/2015   Lab Results  Component Value Date   TRIG 58 07/20/2015   No results found for: CHOLHDL No results found for: LDLDIRECT Glucose:  Glucose  Date Value Ref Range Status  07/20/2015 85 65 - 99 mg/dL Final   Colorectal cancer: no colon cancer in the family Lung cancer:  n/a AAA: n/a Aspirin: n/a Diet: pretty good eater, hardly eats fried foods;  Exercise: regularly, 4-5 days a week Skin cancer: sunscreen, no worrisome moles Contraception: OCPs, working well, no migraine with aura, nonsmoker, no DVT   Past Medical History:  Diagnosis Date  . Abnormal thyroid blood test  07/28/2015  . GAD (generalized anxiety disorder) 07/20/2015  . Insomnia 07/20/2015   Past Surgical History:  Procedure Laterality Date  . TONSILLECTOMY AND ADENOIDECTOMY    . WISDOM TOOTH EXTRACTION     Family History  Problem Relation Age of Onset  . Ovarian cysts Mother   . Diabetes Maternal Grandmother   . Hypertension Maternal Grandmother    Social History  Substance Use Topics  . Smoking status: Never Smoker  . Smokeless tobacco: Never Used  . Alcohol use No   Review of Systems  Objective:   Vitals:   01/15/17 1419  BP: 110/62  Pulse: 82  Resp: 14  Temp: 98.3 F (36.8 C)  TempSrc: Oral  SpO2: 97%  Weight: 130 lb 1.6 oz (59 kg)  Height: 5' 5.63" (1.667 m)   Body mass index is 21.24 kg/m. Wt Readings from Last 3 Encounters:  01/15/17 130 lb 1.6 oz (59 kg)  11/02/16 132 lb 8 oz (60.1 kg)  10/16/16 132 lb (59.9 kg)   Physical Exam  Constitutional: She appears well-developed and well-nourished.  HENT:  Head: Normocephalic and atraumatic.  Right Ear: Hearing, tympanic membrane, external ear and ear canal normal.  Left Ear: Hearing, tympanic membrane, external ear and ear canal normal.  Eyes: Conjunctivae and EOM are normal. Right eye exhibits no hordeolum. Left eye exhibits no hordeolum. No scleral icterus.  Neck: No thyromegaly present.  Cardiovascular: Normal rate, regular rhythm,   S1 normal, S2 normal and normal heart sounds.   No extrasystoles are present.  Pulmonary/Chest: Effort normal and breath sounds normal. No respiratory distress. Right breast exhibits no inverted nipple, no mass, no nipple discharge, no skin change and no tenderness. Left breast exhibits no inverted nipple, no mass, no nipple discharge, no skin change and no tenderness. Breasts are symmetrical.  Abdominal: Soft. Normal appearance and bowel sounds are normal. She exhibits no distension, no abdominal bruit, no pulsatile midline mass and no mass. There is no hepatosplenomegaly. There is no  tenderness. No hernia.  Musculoskeletal: Normal range of motion. She exhibits no edema.  Lymphadenopathy:       Head (right side): No submandibular adenopathy present.       Head (left side): No submandibular adenopathy present.    She has no cervical adenopathy.    She has no axillary adenopathy.  Neurological: She is alert. She displays no tremor. No cranial nerve deficit. She exhibits normal muscle tone. Gait normal.  Reflex Scores:      Patellar reflexes are 2+ on the right side and 2+ on the left side. Skin: Skin is warm and dry. No bruising and no ecchymosis noted. No cyanosis. No pallor.  Psychiatric: Her speech is normal and behavior is normal. Thought content normal. Her mood appears not anxious. She does not exhibit a depressed mood.    Assessment/Plan:   Problem List Items Addressed This Visit      Other   Preventative health care - Primary    USPSTF grade A and B recommendations reviewed with patient; age-appropriate recommendations, preventive care, screening tests, etc discussed and encouraged; healthy living encouraged; see AVS for patient education given to patient; GYN components UTD, pap smear July 2017; next due 2020      Encounter for birth control pills maintenance    She has been doing well with current OCP Rx; no migraine with aura, no hx of DVT, nonsmoker, etc; will refill OCPS       Other Visit Diagnoses    Screening for tuberculosis       Relevant Orders   Quantiferon tb gold assay   Immunity status testing       Relevant Orders   Varicella zoster antibody, IgG   Screen for STD (sexually transmitted disease)       Relevant Orders   GC/Chlamydia Probe Amp       Meds ordered this encounter  Medications  . TRI-SPRINTEC 0.18/0.215/0.25 MG-35 MCG tablet    Sig: Take 1 tablet by mouth daily.    Dispense:  1 Package    Refill:  12   Orders Placed This Encounter  Procedures  . GC/Chlamydia Probe Amp  . Quantiferon tb gold assay  . Varicella zoster  antibody, IgG    Follow up plan: Return in about 1 year (around 01/15/2018) for complete physical.  An After Visit Summary was printed and given to the patient.

## 2017-01-16 LAB — GC/CHLAMYDIA PROBE AMP
CT Probe RNA: NOT DETECTED
GC PROBE AMP APTIMA: NOT DETECTED

## 2017-01-16 LAB — VARICELLA ZOSTER ANTIBODY, IGG: VARICELLA IGG: 1796 {index} — AB (ref <135.00)

## 2017-01-17 LAB — QUANTIFERON TB GOLD ASSAY (BLOOD)
Interferon Gamma Release Assay: NEGATIVE
Mitogen-Nil: 9.32 IU/mL
QUANTIFERON TB AG MINUS NIL: 0.01 [IU]/mL
Quantiferon Nil Value: 0.14 IU/mL

## 2017-02-04 ENCOUNTER — Other Ambulatory Visit: Payer: Self-pay | Admitting: Family Medicine

## 2017-02-04 NOTE — Telephone Encounter (Signed)
I just saw her at the end of July and approved refills of her birth control pills then Thank you

## 2017-02-04 NOTE — Telephone Encounter (Signed)
Left detailed voicemial 

## 2017-02-04 NOTE — Telephone Encounter (Signed)
Pt was told that due to her age she only has to do annual paps every 3 years. However patient is completely out of her birthcontrol and is needing a refill sent to [facility].

## 2017-03-06 ENCOUNTER — Ambulatory Visit: Payer: Self-pay | Admitting: Obstetrics and Gynecology

## 2017-05-31 ENCOUNTER — Other Ambulatory Visit: Payer: Self-pay | Admitting: Family Medicine

## 2017-09-22 ENCOUNTER — Other Ambulatory Visit: Payer: Self-pay | Admitting: Family Medicine

## 2017-09-23 NOTE — Telephone Encounter (Signed)
Checking status, please advise.

## 2017-09-23 NOTE — Telephone Encounter (Signed)
Rx sent 

## 2017-12-06 ENCOUNTER — Telehealth: Payer: Self-pay | Admitting: Family Medicine

## 2017-12-06 MED ORDER — HYDROXYZINE HCL 10 MG PO TABS: 20.0000 mg | ORAL_TABLET | Freq: Four times a day (QID) | ORAL | 0 refills | Status: DC | PRN

## 2017-12-06 NOTE — Telephone Encounter (Signed)
So I've sent in a different form of hydroxyzine to the pharmacy That will just be a substitute until the other hydroxyzine is available again

## 2017-12-06 NOTE — Telephone Encounter (Signed)
Copied from CRM [phone]. Topic: Quick Communication - See Telephone Encounter >> Dec 06, 2017 12:24 PM Oneal GroutSebastian, Jennifer S wrote: CRM for notification. See Telephone encounter for: 12/06/17. Per [facility] hydrOXYzine (VISTARIL) 25 MG capsule is on back order, unable to find at another location. Able to change meds?

## 2017-12-06 NOTE — Telephone Encounter (Signed)
Left voicemail with pharmacy 

## 2017-12-10 ENCOUNTER — Ambulatory Visit: Payer: BLUE CROSS/BLUE SHIELD | Admitting: Nurse Practitioner

## 2017-12-10 ENCOUNTER — Encounter: Payer: Self-pay | Admitting: Nurse Practitioner

## 2017-12-10 VITALS — BP 122/70 | HR 80 | Temp 98.5°F | Resp 16 | Ht 66.0 in | Wt 129.0 lb

## 2017-12-10 DIAGNOSIS — K120 Recurrent oral aphthae: Secondary | ICD-10-CM | POA: Diagnosis not present

## 2017-12-10 MED ORDER — MAGIC MOUTHWASH W/LIDOCAINE: 5.0000 mL | Freq: Three times a day (TID) | ORAL | 1 refills | Status: DC

## 2017-12-10 NOTE — Patient Instructions (Signed)
Canker Sores Canker sores are small, painful sores that develop inside your mouth. They may also be called aphthous ulcers. You can get canker sores on the inside of your lips or cheeks, on your tongue, or anywhere inside your mouth. You can have just one canker sore or several of them. Canker sores cannot be passed from one person to another (noncontagious). These sores are different than the sores that you may get on the outside of your lips (cold sores or fever blisters). Canker sores usually start as painful red bumps. Then they turn into small white, yellow, or gray ulcers that have red borders. The ulcers may be quite painful. The pain may be worse when you eat or drink. What are the causes? The cause of this condition is not known. What increases the risk? This condition is more likely to develop in:  Women.  People in their teens or 20s.  Women who are having their menstrual period.  People who are under a lot of emotional stress.  People who do not get enough iron or B vitamins.  People who have poor oral hygiene.  People who have an injury inside the mouth. This can happen after having dental work or from chewing something hard.  What are the signs or symptoms? Along with the canker sore, symptoms may also include:  Fever.  Fatigue.  Swollen lymph nodes in your neck.  How is this diagnosed? This condition can be diagnosed based on your symptoms. Your health care provider will also examine your mouth. Your health care provider may also do tests if you get canker sores often or if they are very bad. Tests may include:  Blood tests to rule out other causes of canker sores.  Taking swabs from the sore to check for infection.  Taking a small piece of skin from the sore (biopsy) to test it for cancer.  How is this treated? Most canker sores clear up without treatment in about 10 days. Home care is usually the only treatment that you will need. Over-the-counter medicines  can relieve discomfort.If you have severe canker sores, your health care provider may prescribe:  Numbing ointment to relieve pain.  Vitamins.  Steroid medicines. These may be given as: ? Oral pills. ? Mouth rinses. ? Gels.  Antibiotic mouth rinse.  Follow these instructions at home:  Apply, take, or use medicines only as directed by your health care provider. These include vitamins.  If you were prescribed an antibiotic mouth rinse, finish all of it even if you start to feel better.  Until the sores are healed: ? Do not drink coffee or citrus juices. ? Do not eat spicy or salty foods.  Use a mild, over-the-counter mouth rinse as directed by your health care provider.  Practice good oral hygiene. ? Floss your teeth every day. ? Brush your teeth with a soft brush twice each day. Contact a health care provider if:  Your symptoms do not get better after two weeks.  You also have a fever or swollen glands.  You get canker sores often.  You have a canker sore that is getting larger.  You cannot eat or drink due to your canker sores. This information is not intended to replace advice given to you by your health care provider. Make sure you discuss any questions you have with your health care provider. Document Released: 09/29/2010 Document Revised: 11/10/2015 Document Reviewed: 05/05/2014 Elsevier Interactive Patient Education  2018 [employer]  

## 2017-12-10 NOTE — Progress Notes (Addendum)
Name: Haley Bell   MRN: [id number]    DOB: [birth year]   Date:12/10/2017       Progress Note  Subjective  Chief Complaint  Chief Complaint  Patient presents with  . Mouth Lesions    back of throat for 4 days    HPI Patient noted oral pain on Saturday states on Sunday noted ulcer in the back of her right side of throat, states anything that touches it is painful. Sts cold stuff helps pain- like ice cream. Has to sleep on left side because even saliva causes pain. States had tried saline gargles with temporary relief of pain. States on Sunday it was very red and states now is white color.    Patient Active Problem List   Diagnosis Date Noted  . Encounter for birth control pills maintenance 01/15/2017  . Fatigue 02/21/2016  . Abnormal thyroid blood test 07/28/2015  . Preventative health care 07/20/2015  . GAD (generalized anxiety disorder) 07/20/2015  . Insomnia 07/20/2015    Past Medical History:  Diagnosis Date  . Abnormal thyroid blood test 07/28/2015  . GAD (generalized anxiety disorder) 07/20/2015  . Insomnia 07/20/2015    Past Surgical History:  Procedure Laterality Date  . TONSILLECTOMY AND ADENOIDECTOMY    . WISDOM TOOTH EXTRACTION      Social History   Tobacco Use  . Smoking status: Never Smoker  . Smokeless tobacco: Never Used  Substance Use Topics  . Alcohol use: No     Current Outpatient Medications:  .  ferrous sulfate 325 (65 FE) MG EC tablet, Take 325 mg by mouth 3 (three) times daily with meals., Disp: , Rfl:  .  folic acid (FOLVITE) 1 MG tablet, Take 1 mg by mouth daily., Disp: , Rfl:  .  hydrOXYzine (ATARAX/VISTARIL) 10 MG tablet, Take 2 tablets (20 mg total) by mouth every 6 (six) hours as needed., Disp: 30 tablet, Rfl: 0 .  hydrOXYzine (VISTARIL) 25 MG capsule, Take by mouth., Disp: , Rfl:  .  Multiple Vitamin (MULTIVITAMIN) tablet, Take 1 tablet by mouth daily., Disp: , Rfl:  .  TRI-SPRINTEC 0.18/0.215/0.25 MG-35 MCG tablet, Take 1 tablet by mouth  daily., Disp: 1 Package, Rfl: 12 .  vitamin B-12 (CYANOCOBALAMIN) 100 MCG tablet, Take 100 mcg by mouth daily., Disp: , Rfl:   No Known Allergies  ROS  Constitutional: Negative for fever or unintentional weight change.  Respiratory: Negative for cough and shortness of breath.   Cardiovascular: Negative for chest pain or palpitations.  Gastrointestinal: Negative for abdominal pain, no bowel changes.  Musculoskeletal: Negative for gait problem or joint swelling.  Skin: Negative for rash.  Neurological: Negative for dizziness or headache.  No other specific complaints in a complete review of systems (except as listed in HPI above).  Objective  Vitals:   12/10/17 1046  BP: 122/70  Pulse: (!) 101  Resp: 16  Temp: 98.5 F (36.9 C)  TempSrc: Oral  SpO2: 97%  Weight: 129 lb (58.5 kg)  Height: 5\' 6"  (1.676 m)     Body mass index is 20.82 kg/m.  Nursing Note and Vital Signs reviewed.  Physical Exam  HENT:  Mouth/Throat:      Constitutional: Patient appears well-developed and well-nourished.  No distress.  HEENT: head atraumatic, normocephalic, pupils equal and reactive to light, no maxillary or frontal sinus tenderness  Cardiovascular: Normal rate Pulmonary/Chest: Effort normal  Psychiatric: Patient has a normal mood and affect. behavior is normal. Judgment and thought content normal.  No results  found for this or any previous visit (from the past 72 hour(s)).  Assessment & Plan  1. Canker sore  Until the sores are healed: ? Do not drink coffee or citrus juices. ? Do not eat spicy or salty foods.  Use a mild, over-the-counter mouth rinse as directed by your health care provider.  Practice good oral hygiene. ? Floss your teeth every day. ? Brush your teeth with a soft brush twice each day. - magic mouthwash w/lidocaine SOLN; Take 5 mLs by mouth 3 (three) times daily.  Dispense: 100 mL; Refill: 1   -Red flags and when to present for emergency care or RTC  including new/worsening/un-resolving symptoms within 2 weeks,  reviewed with patient at time of visit. Follow up and care instructions discussed and provided in AVS.  ------------------------------------------ I have reviewed this encounter including the documentation in this note and/or discussed this patient with the provider, Sharyon Cable DNP AGNP-C. I am certifying that I agree with the content of this note as supervising physician. Baruch Gouty, MD [facility] 12/10/2017, 1:29 PM

## 2018-01-05 ENCOUNTER — Other Ambulatory Visit: Payer: Self-pay | Admitting: Family Medicine

## 2018-01-24 ENCOUNTER — Other Ambulatory Visit: Payer: Self-pay | Admitting: Family Medicine

## 2018-01-24 NOTE — Telephone Encounter (Signed)
Patient has an appointment next month; okay for Rx

## 2018-02-25 ENCOUNTER — Ambulatory Visit: Payer: BC Managed Care – PPO | Admitting: Nurse Practitioner

## 2018-02-25 ENCOUNTER — Encounter: Payer: Self-pay | Admitting: Nurse Practitioner

## 2018-02-25 VITALS — BP 100/70 | HR 97 | Temp 98.3°F | Resp 12 | Ht 66.0 in | Wt 138.4 lb

## 2018-02-25 DIAGNOSIS — M79671 Pain in right foot: Secondary | ICD-10-CM | POA: Diagnosis not present

## 2018-02-25 MED ORDER — MELOXICAM 7.5 MG PO TABS: 7.5000 mg | ORAL_TABLET | Freq: Every day | ORAL | 0 refills | Status: DC

## 2018-02-25 NOTE — Progress Notes (Signed)
Name: Haley Bell   MRN: [id number]    DOB: 27-May-1991   Date:02/25/2018       Progress Note  Subjective  Chief Complaint  Chief Complaint  Patient presents with  . Foot Pain    right foot. Pain started on early Monday morning. Pulsating and burning on pinky toe. Was unable to walk on it when it first started got better thorughout the day. Pain resumed after she got home from work. Epson salt.  . Foot Swelling    HPI  Monday woke up to use the bathroom and stepped out of bed and felt tenderness difficult to bear weight- states right foot- foot pad under big toe, and 4th and 5th digit. Pain is worse first thing in the morning, feels better later in the day. epson salt bath did not improve pain. Took ibuprofen- unsure if it has helped. Had had corns before   Denies injuries, no new shoes, paresthesias.   Patient Active Problem List   Diagnosis Date Noted  . Encounter for birth control pills maintenance 01/15/2017  . Fatigue 02/21/2016  . Abnormal thyroid blood test 07/28/2015  . Preventative health care 07/20/2015  . GAD (generalized anxiety disorder) 07/20/2015  . Insomnia 07/20/2015    Past Medical History:  Diagnosis Date  . Abnormal thyroid blood test 07/28/2015  . GAD (generalized anxiety disorder) 07/20/2015  . Insomnia 07/20/2015    Past Surgical History:  Procedure Laterality Date  . TONSILLECTOMY AND ADENOIDECTOMY    . WISDOM TOOTH EXTRACTION      Social History   Tobacco Use  . Smoking status: Never Smoker  . Smokeless tobacco: Never Used  Substance Use Topics  . Alcohol use: No     Current Outpatient Medications:  .  ferrous sulfate 325 (65 FE) MG EC tablet, Take 325 mg by mouth 3 (three) times daily with meals., Disp: , Rfl:  .  folic acid (FOLVITE) 1 MG tablet, Take 1 mg by mouth daily., Disp: , Rfl:  .  hydrOXYzine (ATARAX/VISTARIL) 10 MG tablet, TAKE 2 TABLETS BY MOUTH EVERY 6 HOURS AS NEEDED, Disp: 50 tablet, Rfl: 2 .  magic mouthwash w/lidocaine  SOLN, Take 5 mLs by mouth 3 (three) times daily., Disp: 100 mL, Rfl: 1 .  Multiple Vitamin (MULTIVITAMIN) tablet, Take 1 tablet by mouth daily., Disp: , Rfl:  .  TRI-SPRINTEC 0.18/0.215/0.25 MG-35 MCG tablet, TAKE 1 TABLET BY MOUTH ONCE DAILY, Disp: 28 tablet, Rfl: 1 .  vitamin B-12 (CYANOCOBALAMIN) 100 MCG tablet, Take 100 mcg by mouth daily., Disp: , Rfl:  .  hydrOXYzine (VISTARIL) 25 MG capsule, Take by mouth., Disp: , Rfl:   No Known Allergies  ROS  No other specific complaints in a complete review of systems (except as listed in HPI above).  Objective  Vitals:   02/25/18 1512  BP: 100/70  Pulse: 97  Resp: 12  Temp: 98.3 F (36.8 C)  TempSrc: Oral  SpO2: 99%  Weight: 138 lb 6.4 oz (62.8 kg)  Height: 5\' 6"  (1.676 m)     Body mass index is 22.34 kg/m.  Nursing Note and Vital Signs reviewed.  Physical Exam  Constitutional: She is oriented to person, place, and time. She appears well-developed and well-nourished.  HENT:  Head: Normocephalic and atraumatic.  Cardiovascular: Normal rate and intact distal pulses.  Pulmonary/Chest: Effort normal.  Musculoskeletal: Normal range of motion. She exhibits edema and tenderness. She exhibits no deformity.       Right ankle: Normal.  Feet:  Neurological: She is alert and oriented to person, place, and time.  Skin: Skin is warm and dry. Capillary refill takes less than 2 seconds.  Psychiatric: She has a normal mood and affect. Her behavior is normal. Judgment and thought content normal.       No results found for this or any previous visit (from the past 48 hour(s)).  Assessment & Plan   1. Right foot pain - Rest - Ice- can toss water bottle in the freezer and roll feet over it a few time throughout the day - Try aleve (naproxen) 500mg  twice a day with food for at least one week. OR can use mobic DO Not use both. - If unimproved in one week call and we can order mobic (stronger antiinflammatory), an x-ray or refer  to podiatry.  - meloxicam (MOBIC) 7.5 MG tablet; Take 1 tablet (7.5 mg total) by mouth daily.  Dispense: 30 tablet; Refill: 0

## 2018-02-25 NOTE — Patient Instructions (Signed)
-   Rest - Ice- can toss water bottle in the freezer and roll feet over it a few time throughout the day - Try aleve (naproxen) 500mg  twice a day with food for at least one week.  - If unimproved in one week call and we can order mobic (stronger antiinflammatory), an x-ray or refer to podiatry.

## 2018-02-28 ENCOUNTER — Encounter: Payer: BLUE CROSS/BLUE SHIELD | Admitting: Family Medicine

## 2018-03-28 ENCOUNTER — Encounter: Payer: BLUE CROSS/BLUE SHIELD | Admitting: Family Medicine

## 2018-03-29 ENCOUNTER — Other Ambulatory Visit: Payer: Self-pay | Admitting: Family Medicine

## 2018-03-31 ENCOUNTER — Encounter: Payer: BLUE CROSS/BLUE SHIELD | Admitting: Family Medicine

## 2018-03-31 NOTE — Telephone Encounter (Signed)
Pt has been scheduled with [name].  Pt agreed that would be just fine. Pt appt 04/18/18 @ 3:40 pm. Requested last appt of the day due to work schedule

## 2018-03-31 NOTE — Telephone Encounter (Signed)
Called [phone] @ 9:00 lvm informing that prescription has been sent to pharmacy but to give the office a call to schedule the physical appt sooner. Pt has the option of seeing Lanora Manis with Dr Sherie Don is booked.

## 2018-03-31 NOTE — Telephone Encounter (Signed)
Please make sure patient is scheduled for a complete physical in the next 28 days; we will need to see her for further birth control refills

## 2018-04-18 ENCOUNTER — Ambulatory Visit (INDEPENDENT_AMBULATORY_CARE_PROVIDER_SITE_OTHER): Payer: BC Managed Care – PPO | Admitting: Nurse Practitioner

## 2018-04-18 ENCOUNTER — Encounter: Payer: Self-pay | Admitting: Nurse Practitioner

## 2018-04-18 VITALS — BP 120/80 | HR 84 | Temp 98.3°F | Resp 12 | Ht 66.0 in | Wt 138.1 lb

## 2018-04-18 DIAGNOSIS — G4700 Insomnia, unspecified: Secondary | ICD-10-CM

## 2018-04-18 DIAGNOSIS — Z3041 Encounter for surveillance of contraceptive pills: Secondary | ICD-10-CM

## 2018-04-18 DIAGNOSIS — F411 Generalized anxiety disorder: Secondary | ICD-10-CM

## 2018-04-18 DIAGNOSIS — Z0000 Encounter for general adult medical examination without abnormal findings: Secondary | ICD-10-CM | POA: Diagnosis not present

## 2018-04-18 DIAGNOSIS — K120 Recurrent oral aphthae: Secondary | ICD-10-CM | POA: Diagnosis not present

## 2018-04-18 MED ORDER — HYDROXYZINE HCL 10 MG PO TABS: 10.0000 mg | ORAL_TABLET | Freq: Every evening | ORAL | 3 refills | Status: DC | PRN

## 2018-04-18 MED ORDER — TRI-SPRINTEC 0.18/0.215/0.25 MG-35 MCG PO TABS: 1.0000 | ORAL_TABLET | Freq: Every day | ORAL | 11 refills | Status: DC

## 2018-04-18 MED ORDER — MAGIC MOUTHWASH W/LIDOCAINE: 5.0000 mL | Freq: Three times a day (TID) | ORAL | 1 refills | Status: DC

## 2018-04-18 NOTE — Progress Notes (Signed)
Name: Haley Bell   MRN: [id number]    DOB: 01-13-1991   Date:04/18/2018       Progress Note  Subjective  Chief Complaint  Chief Complaint  Patient presents with  . Annual Exam    HPI  Non-fasting labs; ate tacos for lunch.  ____________ Patient presents for annual CPE.  Diet:  Eats three meals a day; snacks in between.  2 servings of vegetables a day and 2 fruits Drinks water mostly Milk in the morning, occasionally apple juice.  Exercise:  2-3 days a week 30 minutes of walking, running, ab work outs, active job.  USPSTF grade A and B recommendations    Office Visit from 02/25/2018 in [facility]  AUDIT-C Score  0     Depression:  Depression screen [facility] 2/9 04/18/2018 12/10/2017 01/15/2017 11/02/2016 10/16/2016  Decreased Interest 0 0 0 0 0  Down, Depressed, Hopeless 0 0 0 0 0  PHQ - 2 Score 0 0 0 0 0  Altered sleeping - 0 - - -  Tired, decreased energy - 0 - - -  Change in appetite - 0 - - -  Moving slowly or fidgety/restless - 0 - - -  Suicidal thoughts - 0 - - -  PHQ-9 Score - 0 - - -  Difficult doing work/chores - Not difficult at all - - -   Hypertension: BP Readings from Last 3 Encounters:  04/18/18 120/80  02/25/18 100/70  12/10/17 122/70   Obesity: Wt Readings from Last 3 Encounters:  04/18/18 138 lb 1.6 oz (62.6 kg)  02/25/18 138 lb 6.4 oz (62.8 kg)  12/10/17 129 lb (58.5 kg)   BMI Readings from Last 3 Encounters:  04/18/18 22.29 kg/m  02/25/18 22.34 kg/m  12/10/17 20.82 kg/m     STD testing and prevention (HIV/chl/gon/syphilis): declines Intimate partner violence: denies  Sexual History/Pain during Intercourse: denies  Menstrual History/LMP/Abnormal Bleeding: no abnormal bleeding monthly periods    Cervical cancer screening: completed in 2017; next due 2020 Skin cancer: no history;   Lipids:  Lab Results  Component Value Date   CHOL 157 07/20/2015   Lab Results  Component Value Date   HDL 67 07/20/2015   Lab  Results  Component Value Date   LDLCALC 78 07/20/2015   Lab Results  Component Value Date   TRIG 58 07/20/2015   No results found for: CHOLHDL No results found for: LDLDIRECT  Glucose:  Glucose  Date Value Ref Range Status  07/20/2015 85 65 - 99 mg/dL Final     Patient Active Problem List   Diagnosis Date Noted  . Encounter for birth control pills maintenance 01/15/2017  . Fatigue 02/21/2016  . Abnormal thyroid blood test 07/28/2015  . Preventative health care 07/20/2015  . GAD (generalized anxiety disorder) 07/20/2015  . Insomnia 07/20/2015    Past Surgical History:  Procedure Laterality Date  . TONSILLECTOMY AND ADENOIDECTOMY    . WISDOM TOOTH EXTRACTION      Family History  Problem Relation Age of Onset  . Ovarian cysts Mother   . Diabetes Maternal Grandmother   . Hypertension Maternal Grandmother     Social History   Socioeconomic History  . Marital status: Married    Spouse name: Not on file  . Number of children: Not on file  . Years of education: Not on file  . Highest education level: Bachelor's degree (e.g., BA, AB, BS)  Occupational History  . Not on file  Social Needs  .  Financial resource strain: Not hard at all  . Food insecurity:    Worry: Never true    Inability: Never true  . Transportation needs:    Medical: No    Non-medical: No  Tobacco Use  . Smoking status: Never Smoker  . Smokeless tobacco: Never Used  Substance and Sexual Activity  . Alcohol use: No  . Drug use: No  . Sexual activity: Yes  Lifestyle  . Physical activity:    Days per week: 3 days    Minutes per session: 30 min  . Stress: To some extent  Relationships  . Social connections:    Talks on phone: More than three times a week    Gets together: More than three times a week    Attends religious service: More than 4 times per year    Active member of club or organization: Yes    Attends meetings of clubs or organizations: More than 4 times per year     Relationship status: Married  . Intimate partner violence:    Fear of current or ex partner: No    Emotionally abused: No    Physically abused: No    Forced sexual activity: No  Other Topics Concern  . Not on file  Social History Narrative  . Not on file     Current Outpatient Medications:  .  ferrous sulfate 325 (65 FE) MG EC tablet, Take 325 mg by mouth 3 (three) times daily with meals., Disp: , Rfl:  .  folic acid (FOLVITE) 1 MG tablet, Take 1 mg by mouth daily., Disp: , Rfl:  .  hydrOXYzine (ATARAX/VISTARIL) 10 MG tablet, TAKE 2 TABLETS BY MOUTH EVERY 6 HOURS AS NEEDED, Disp: 50 tablet, Rfl: 2 .  hydrOXYzine (VISTARIL) 25 MG capsule, Take by mouth., Disp: , Rfl:  .  Multiple Vitamin (MULTIVITAMIN) tablet, Take 1 tablet by mouth daily., Disp: , Rfl:  .  TRI-SPRINTEC 0.18/0.215/0.25 MG-35 MCG tablet, Take 1 tablet by mouth daily. Appointment needed for physical for further refills, Disp: 28 tablet, Rfl: 0 .  vitamin B-12 (CYANOCOBALAMIN) 100 MCG tablet, Take 100 mcg by mouth daily., Disp: , Rfl:  .  magic mouthwash w/lidocaine SOLN, Take 5 mLs by mouth 3 (three) times daily. (Patient not taking: Reported on 04/18/2018), Disp: 100 mL, Rfl: 1 .  meloxicam (MOBIC) 7.5 MG tablet, Take 1 tablet (7.5 mg total) by mouth daily. (Patient not taking: Reported on 04/18/2018), Disp: 30 tablet, Rfl: 0  No Known Allergies   Review of Systems  Constitutional: Negative for chills, fever and malaise/fatigue.  HENT: Negative for congestion, sinus pain and sore throat.   Eyes: Negative for blurred vision (sees optometrist annually) and double vision.  Respiratory: Negative for cough and shortness of breath.   Cardiovascular: Negative for chest pain, palpitations and leg swelling.  Gastrointestinal: Positive for constipation (taking iron pills). Negative for abdominal pain, diarrhea, nausea and vomiting.  Genitourinary: Negative for dysuria.  Musculoskeletal: Negative for back pain and joint pain.   Skin: Negative for rash.  Neurological: Negative for dizziness, tingling and headaches.  Psychiatric/Behavioral: Negative for depression. The patient has insomnia (resolved with hydroxyzine, gets 7 hours of sleep). The patient is not nervous/anxious (helps with medicince, gets it at night when planning the next day).     Objective  Vitals:   04/18/18 1541  BP: 120/80  Pulse: 84  Resp: 12  Temp: 98.3 F (36.8 C)  TempSrc: Oral  SpO2: 98%  Weight: 138 lb 1.6 oz (  62.6 kg)  Height: 5\' 6"  (1.676 m)    Body mass index is 22.29 kg/m.  Physical Exam Constitutional: Patient appears well-developed and well-nourished. No distress.  HENT: Head: Normocephalic and atraumatic. Ears: B TMs ok, no erythema or effusion; Nose: Nose normal. Mouth/Throat: Oropharynx is clear and moist. No oropharyngeal exudate.  Eyes: Conjunctivae and EOM are normal. Pupils are equal, round, and reactive to light. No scleral icterus.  Neck: Normal range of motion. Neck supple. No JVD present. No thyromegaly present.  Cardiovascular: Normal rate, regular rhythm and normal heart sounds.  No murmur heard. No BLE edema. Pulmonary/Chest: Effort normal and breath sounds normal. No respiratory distress. Abdominal: Soft. Bowel sounds are normal, no distension. There is no tenderness. no masses Breast: no lumps or masses, no nipple discharge or rashes FEMALE GENITALIA: deferred Musculoskeletal: Normal range of motion, no joint effusions. No gross deformities Neurological: he is alert and oriented to person, place, and time. No cranial nerve deficit. Coordination, balance, strength, speech and gait are normal.  Skin: Skin is warm and dry. No rash noted. No erythema.  Psychiatric: Patient has a normal mood and affect. behavior is normal. Judgment and thought content normal.   No results found for this or any previous visit (from the past 2160 hour(s)).  Assessment & Plan  1. Well adult exam - CBC with Differential -  COMPLETE METABOLIC PANEL WITH GFR  2. Encounter for surveillance of contraceptive pills - TRI-SPRINTEC 0.18/0.215/0.25 MG-35 MCG tablet; Take 1 tablet by mouth daily. Appointment needed for physical for further refills  Dispense: 28 tablet; Refill: 11  3. Canker sore - magic mouthwash w/lidocaine SOLN; Take 5 mLs by mouth 3 (three) times daily.  Dispense: 100 mL; Refill: 1  4. Insomnia, unspecified type - hydrOXYzine (ATARAX/VISTARIL) 10 MG tablet; Take 1 tablet (10 mg total) by mouth at bedtime as needed.  Dispense: 90 tablet; Refill: 3  5. GAD (generalized anxiety disorder) - hydrOXYzine (ATARAX/VISTARIL) 10 MG tablet; Take 1 tablet (10 mg total) by mouth at bedtime as needed.  Dispense: 90 tablet; Refill: 3   -USPSTF grade A and B recommendations reviewed with patient; age-appropriate recommendations, preventive care, screening tests, etc discussed and encouraged; healthy living encouraged; see AVS for patient education given to patient -Discussed importance of 150 minutes of physical activity weekly, eat two servings of fish weekly, eat one serving of tree nuts ( cashews, pistachios, pecans, almonds.Marland Kitchen) every other day, eat 6 servings of fruit/vegetables daily and drink plenty of water and avoid sweet beverages.

## 2018-04-18 NOTE — Patient Instructions (Addendum)
High iron Foods include: Animal- Chicken liver,Oysters, Clams, Beef liver, Beef (chuck roast, lean ground beef), [place] leg, Tuna Eggs Shrimp Leg of lamb Plant- Raisin bran (enriched), Instant oatmeal,Beans (kidney, lima, Navy),Tofu, Lentils, Molasses, Spinach, Whole wheat bread, Peanut butter, Brown rice  General recommendations: 150 minutes of physical activity weekly, eat two servings of fish weekly, eat one serving of tree nuts ( cashews, pistachios, pecans, almonds.Marland Kitchen) every other day, eat 6 servings of fruit/vegetables daily and drink plenty of water and avoid sweet beverages.

## 2018-04-19 LAB — COMPLETE METABOLIC PANEL WITH GFR
AG Ratio: 2 (calc) (ref 1.0–2.5)
ALBUMIN MSPROF: 4.7 g/dL (ref 3.6–5.1)
ALT: 8 U/L (ref 6–29)
AST: 17 U/L (ref 10–30)
Alkaline phosphatase (APISO): 45 U/L (ref 33–115)
BUN: 12 mg/dL (ref 7–25)
CALCIUM: 9.4 mg/dL (ref 8.6–10.2)
CO2: 27 mmol/L (ref 20–32)
Chloride: 103 mmol/L (ref 98–110)
Creat: 0.92 mg/dL (ref 0.50–1.10)
GFR, EST NON AFRICAN AMERICAN: 87 mL/min/{1.73_m2} (ref >=60)
GFR, Est African American: 100 mL/min/{1.73_m2} (ref >=60)
GLOBULIN: 2.4 g/dL (ref 1.9–3.7)
GLUCOSE: 91 mg/dL (ref 65–139)
Potassium: 3.9 mmol/L (ref 3.5–5.3)
SODIUM: 137 mmol/L (ref 135–146)
Total Bilirubin: 0.3 mg/dL (ref 0.2–1.2)
Total Protein: 7.1 g/dL (ref 6.1–8.1)

## 2018-04-19 LAB — CBC WITH DIFFERENTIAL/PLATELET
BASOS ABS: 21 {cells}/uL (ref 0–200)
Basophils Relative: 0.4 %
Eosinophils Absolute: 170 cells/uL (ref 15–500)
Eosinophils Relative: 3.2 %
HCT: 35.4 % (ref 35.0–45.0)
Hemoglobin: 12.2 g/dL (ref 11.7–15.5)
Lymphs Abs: 1966 cells/uL (ref 850–3900)
MCH: 34.4 pg — ABNORMAL HIGH (ref 27.0–33.0)
MCHC: 34.5 g/dL (ref 32.0–36.0)
MCV: 99.7 fL (ref 80.0–100.0)
MPV: 11.8 fL (ref 7.5–12.5)
Monocytes Relative: 10.9 %
NEUTROS PCT: 48.4 %
Neutro Abs: 2565 cells/uL (ref 1500–7800)
Platelets: 194 10*3/uL (ref 140–400)
RBC: 3.55 10*6/uL — ABNORMAL LOW (ref 3.80–5.10)
RDW: 10.8 % — AB (ref 11.0–15.0)
TOTAL LYMPHOCYTE: 37.1 %
WBC: 5.3 10*3/uL (ref 3.8–10.8)
WBCMIX: 578 {cells}/uL (ref 200–950)

## 2018-05-14 ENCOUNTER — Ambulatory Visit: Payer: BC Managed Care – PPO | Admitting: Family Medicine

## 2018-06-09 ENCOUNTER — Encounter: Payer: Self-pay | Admitting: Family Medicine

## 2018-09-18 ENCOUNTER — Ambulatory Visit (INDEPENDENT_AMBULATORY_CARE_PROVIDER_SITE_OTHER): Payer: BC Managed Care – PPO | Admitting: Nurse Practitioner

## 2018-09-18 ENCOUNTER — Encounter: Payer: Self-pay | Admitting: Nurse Practitioner

## 2018-09-18 ENCOUNTER — Other Ambulatory Visit: Payer: Self-pay

## 2018-09-18 VITALS — Temp 97.7°F | Ht 66.0 in | Wt 135.7 lb

## 2018-09-18 DIAGNOSIS — R399 Unspecified symptoms and signs involving the genitourinary system: Secondary | ICD-10-CM | POA: Diagnosis not present

## 2018-09-18 DIAGNOSIS — F411 Generalized anxiety disorder: Secondary | ICD-10-CM | POA: Diagnosis not present

## 2018-09-18 DIAGNOSIS — G4700 Insomnia, unspecified: Secondary | ICD-10-CM | POA: Diagnosis not present

## 2018-09-18 MED ORDER — NITROFURANTOIN MONOHYD MACRO 100 MG PO CAPS: 100.0000 mg | ORAL_CAPSULE | Freq: Two times a day (BID) | ORAL | 0 refills | Status: DC

## 2018-09-18 MED ORDER — HYDROXYZINE HCL 10 MG PO TABS: 10.0000 mg | ORAL_TABLET | Freq: Every evening | ORAL | 1 refills | Status: DC | PRN

## 2018-09-18 NOTE — Progress Notes (Signed)
Virtual Visit via Video Note  I connected with Haley Bell on 09/18/18 at  1:00 PM EDT by a video enabled telemedicine application and verified that I am speaking with the correct person using two identifiers.   I discussed the limitations of evaluation and management by telemedicine and the availability of in person appointments. The patient expressed understanding and agreed to proceed.  History of Present Illness: Monday night she woke up multiple times to urinate. Tuesday night worsened- had frequency and hesitation and urinating small amounts. States last night started to have dysuria in addition to other complaints. States used to get UTI's frequently but hasn't in awhile- last [facility] in 2018[facility].  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  Denies flank pain, nausea, vomiting, fevers, chills, hematuria.  Suprapubic pressure.   Patient states is taking hydroxyzine 10mg  nightly, states it helps with her anxiety and helps her sleep. It helps her stop overthinking. Was taking 2 a night and went down to once nightly.  GAD 7 : Generalized Anxiety Score 09/18/2018  Nervous, Anxious, on Edge 2  Control/stop worrying 0  Worry too much - different things 0  Trouble relaxing 2  Restless 0  Easily annoyed or irritable 0  Afraid - awful might happen 0  Total GAD 7 Score 4  Anxiety Difficulty Not difficult at all     Observations/Objective: Alert, oriented, ambulatory without issue. Nontender when self palpating abdomen or flank, does not some suprapubic pressure and urgency when palpating lower mid abdomen.   Assessment and Plan: 1. UTI symptoms Will treat symptomatically due to pandemic  - nitrofurantoin, macrocrystal-monohydrate, (MACROBID) 100 MG capsule; Take 1 capsule (100 mg total) by mouth 2 (two) times daily.  Dispense:  10 capsule; Refill: 0  2. Insomnia, unspecified type Discussed sleep hygiene  - hydrOXYzine (ATARAX/VISTARIL) 10 MG tablet; Take 1 tablet (10 mg total) by mouth at bedtime as needed.  Dispense: 90 tablet; Refill: 1  3. GAD (generalized anxiety disorder) - hydrOXYzine (ATARAX/VISTARIL) 10 MG tablet; Take 1 tablet (10 mg total) by mouth at bedtime as needed.  Dispense: 90 tablet; Refill: 1   Follow Up Instructions:   follow up PRN  I discussed the assessment and treatment plan with the patient. The patient was provided an opportunity to ask questions and all were answered. The patient agreed with the plan and demonstrated an understanding of the instructions.   The patient was advised to call back or seek an in-person evaluation if the symptoms worsen or if the condition fails to improve as anticipated.  I provided 12 minutes of non-face-to-face time during this encounter.   Cheryle Horsfall, NP

## 2018-11-26 ENCOUNTER — Other Ambulatory Visit: Payer: Self-pay | Admitting: Family Medicine

## 2018-11-26 DIAGNOSIS — K120 Recurrent oral aphthae: Secondary | ICD-10-CM

## 2018-11-26 MED ORDER — MAGIC MOUTHWASH W/LIDOCAINE: 5.0000 mL | Freq: Three times a day (TID) | ORAL | 1 refills | Status: DC | PRN

## 2018-11-26 NOTE — Telephone Encounter (Signed)
Copied from [place] [id number]. Topic: Quick Communication - Rx Refill/Question >> Nov 26, 2018  9:33 AM Richardo Priest, NT wrote: Medication:  magic mouthwash w/lidocaine SOLN  Has the patient contacted their pharmacy? Yes no refills. Patient states she is in need of this due to her throat issues returning.  Preferred Pharmacy (with phone number or street name):  [facility] [address], [place] - [place] [phone] (Phone) [phone] (Fax)  Agent: Please be advised that RX refills may take up to 3 business days. We ask that you follow-up with your pharmacy.

## 2018-11-27 ENCOUNTER — Telehealth: Payer: Self-pay

## 2018-11-27 NOTE — Telephone Encounter (Signed)
There is no recent visit documenting why she is having mouth pain.  Will need an appointment for evaluation.

## 2018-11-27 NOTE — Telephone Encounter (Signed)
Copied from [place] [id number]. Topic: General - Other >> Nov 27, 2018 11:50 AM Carolyn Stare wrote: Pt Mom call to say pt mouth is hurting really bad and is asking if there is something else that can be done or called in

## 2018-11-28 ENCOUNTER — Ambulatory Visit: Payer: BC Managed Care – PPO | Admitting: Family Medicine

## 2018-11-28 NOTE — Telephone Encounter (Signed)
Pt to be added on today

## 2018-11-28 NOTE — Telephone Encounter (Signed)
Pt mother called and is asking that someone please call her for her daughter is in a lot of pain. Was willing to do a virtual but no avail today.

## 2018-11-28 NOTE — Telephone Encounter (Signed)
Pt was added but mother called back and said that they were going to ENT so took her off the schedule

## 2018-12-01 ENCOUNTER — Ambulatory Visit: Payer: BC Managed Care – PPO | Admitting: Family Medicine

## 2019-03-30 ENCOUNTER — Other Ambulatory Visit: Payer: Self-pay | Admitting: Family Medicine

## 2019-03-30 ENCOUNTER — Other Ambulatory Visit: Payer: Self-pay

## 2019-03-30 DIAGNOSIS — Z3041 Encounter for surveillance of contraceptive pills: Secondary | ICD-10-CM

## 2019-03-30 NOTE — Telephone Encounter (Signed)
Requested medication (s) are due for refill today: yes  Requested medication (s) are on the active medication list: yes  Last refill:  04/18/2018  Future visit scheduled: yes  Notes to clinic:  Review for refill Patient has appointment on 04/21/2019.    Requested Prescriptions  Pending Prescriptions Disp Refills   TRI-SPRINTEC 0.18/0.215/0.25 MG-35 MCG tablet 28 tablet 11    Sig: Take 1 tablet by mouth daily. Appointment needed for physical for further refills     OB/GYN:  Contraceptives Passed - 03/30/2019  1:03 PM      Passed - Last BP in normal range    BP Readings from Last 1 Encounters:  04/18/18 120/80         Passed - Valid encounter within last 12 months    Recent Outpatient Visits          6 months ago UTI symptoms   [place], [place], NP   11 months ago Well adult exam   [place], NP   1 year ago Right foot pain   [place], NP   1 year ago Canker sore   [place], Bethel Born, NP   2 years ago Preventative health care   [facility] [place], Satira Anis, MD      Future Appointments            In 3 weeks Delsa Grana, PA-C [facility]

## 2019-03-30 NOTE — Telephone Encounter (Signed)
Copied from [place] [phone]. Topic: Quick Communication - Rx Refill/Question >> Mar 30, 2019 12:56 PM Leward Quan A wrote: Medication: TRI-SPRINTEC 0.18/0.215/0.25 MG-35 MCG tablet   Per patient she is completely out has an appointment scheduled for 04/21/2019 please advise   Has the patient contacted their pharmacy? Yes.   (Agent: If no, request that the patient contact the pharmacy for the refill.) (Agent: If yes, when and what did the pharmacy advise?)  Preferred Pharmacy (with phone number or street name): [facility] [address], [place] - [place] [phone] (Phone) [phone] (Fax)    Agent: Please be advised that RX refills may take up to 3 business days. We ask that you follow-up with your pharmacy.

## 2019-03-31 MED ORDER — TRI-SPRINTEC 0.18/0.215/0.25 MG-35 MCG PO TABS: 1.0000 | ORAL_TABLET | Freq: Every day | ORAL | 0 refills | Status: DC

## 2019-03-31 NOTE — Telephone Encounter (Signed)
Pt states just missed her 1st pill  Last night.  Please refill has an appt next month

## 2019-03-31 NOTE — Addendum Note (Signed)
Addended by: Hubbard Hartshorn on: 03/31/2019 02:51 PM   Modules accepted: Orders

## 2019-03-31 NOTE — Telephone Encounter (Signed)
1 month sent in - advise to start immediately today.  If not able to fill today, will have to come in for Urine pregnancy test prior to starting.

## 2019-04-21 ENCOUNTER — Other Ambulatory Visit: Payer: Self-pay

## 2019-04-21 ENCOUNTER — Ambulatory Visit (INDEPENDENT_AMBULATORY_CARE_PROVIDER_SITE_OTHER): Payer: BC Managed Care – PPO | Admitting: Family Medicine

## 2019-04-21 ENCOUNTER — Encounter: Payer: Self-pay | Admitting: Family Medicine

## 2019-04-21 ENCOUNTER — Telehealth: Payer: Self-pay

## 2019-04-21 ENCOUNTER — Other Ambulatory Visit (HOSPITAL_COMMUNITY)
Admission: RE | Admit: 2019-04-21 | Discharge: 2019-04-21 | Disposition: A | Discharge status: Home / Self Care | Payer: BC Managed Care – PPO | Source: Ambulatory Visit | Attending: Family Medicine | Admitting: Family Medicine

## 2019-04-21 VITALS — BP 116/70 | HR 66 | Temp 98.1°F | Resp 14 | Ht 66.0 in | Wt 135.9 lb

## 2019-04-21 DIAGNOSIS — Z3009 Encounter for other general counseling and advice on contraception: Secondary | ICD-10-CM

## 2019-04-21 DIAGNOSIS — Z0000 Encounter for general adult medical examination without abnormal findings: Secondary | ICD-10-CM

## 2019-04-21 DIAGNOSIS — F411 Generalized anxiety disorder: Secondary | ICD-10-CM

## 2019-04-21 DIAGNOSIS — Z124 Encounter for screening for malignant neoplasm of cervix: Secondary | ICD-10-CM | POA: Insufficient documentation

## 2019-04-21 DIAGNOSIS — G4700 Insomnia, unspecified: Secondary | ICD-10-CM | POA: Diagnosis not present

## 2019-04-21 DIAGNOSIS — Z3041 Encounter for surveillance of contraceptive pills: Secondary | ICD-10-CM

## 2019-04-21 MED ORDER — NORGESTIM-ETH ESTRAD TRIPHASIC 0.18/0.215/0.25 MG-35 MCG PO TABS: 1.0000 | ORAL_TABLET | Freq: Every day | ORAL | 3 refills | Status: DC

## 2019-04-21 MED ORDER — HYDROXYZINE HCL 10 MG PO TABS: 10.0000 mg | ORAL_TABLET | Freq: Every evening | ORAL | 1 refills | Status: DC | PRN

## 2019-04-21 NOTE — Patient Instructions (Signed)
Preventive Care 21-27 Years Old, Female Preventive care refers to visits with your health care provider and lifestyle choices that can promote health and wellness. This includes:  A yearly physical exam. This may also be called an annual well check.  Regular dental visits and eye exams.  Immunizations.  Screening for certain conditions.  Healthy lifestyle choices, such as eating a healthy diet, getting regular exercise, not using drugs or products that contain nicotine and tobacco, and limiting alcohol use. What can I expect for my preventive care visit? Physical exam Your health care provider will check your:  Height and weight. This may be used to calculate body mass index (BMI), which tells if you are at a healthy weight.  Heart rate and blood pressure.  Skin for abnormal spots. Counseling Your health care provider may ask you questions about your:  Alcohol, tobacco, and drug use.  Emotional well-being.  Home and relationship well-being.  Sexual activity.  Eating habits.  Work and work environment.  Method of birth control.  Menstrual cycle.  Pregnancy history. What immunizations do I need?  Influenza (flu) vaccine  This is recommended every year. Tetanus, diphtheria, and pertussis (Tdap) vaccine  You may need a Td booster every 10 years. Varicella (chickenpox) vaccine  You may need this if you have not been vaccinated. Human papillomavirus (HPV) vaccine  If recommended by your health care provider, you may need three doses over 6 months. Measles, mumps, and rubella (MMR) vaccine  You may need at least one dose of MMR. You may also need a second dose. Meningococcal conjugate (MenACWY) vaccine  One dose is recommended if you are age 19-21 years and a first-year college student living in a residence hall, or if you have one of several medical conditions. You may also need additional booster doses. Pneumococcal conjugate (PCV13) vaccine  You may need  this if you have certain conditions and were not previously vaccinated. Pneumococcal polysaccharide (PPSV23) vaccine  You may need one or two doses if you smoke cigarettes or if you have certain conditions. Hepatitis A vaccine  You may need this if you have certain conditions or if you travel or work in places where you may be exposed to hepatitis A. Hepatitis B vaccine  You may need this if you have certain conditions or if you travel or work in places where you may be exposed to hepatitis B. Haemophilus influenzae type b (Hib) vaccine  You may need this if you have certain conditions. You may receive vaccines as individual doses or as more than one vaccine together in one shot (combination vaccines). Talk with your health care provider about the risks and benefits of combination vaccines. What tests do I need?  Blood tests  Lipid and cholesterol levels. These may be checked every 5 years starting at age 20.  Hepatitis C test.  Hepatitis B test. Screening  Diabetes screening. This is done by checking your blood sugar (glucose) after you have not eaten for a while (fasting).  Sexually transmitted disease (STD) testing.  BRCA-related cancer screening. This may be done if you have a family history of breast, ovarian, tubal, or peritoneal cancers.  Pelvic exam and Pap test. This may be done every 3 years starting at age 21. Starting at age 30, this may be done every 5 years if you have a Pap test in combination with an HPV test. Talk with your health care provider about your test results, treatment options, and if necessary, the need for more tests.   Follow these instructions at home: Eating and drinking   Eat a diet that includes fresh fruits and vegetables, whole grains, lean protein, and low-fat dairy.  Take vitamin and mineral supplements as recommended by your health care provider.  Do not drink alcohol if: ? Your health care provider tells you not to drink. ? You are  pregnant, may be pregnant, or are planning to become pregnant.  If you drink alcohol: ? Limit how much you have to 0-1 drink a day. ? Be aware of how much alcohol is in your drink. In the [place] one drink equals one 12 oz bottle of beer (355 mL), one 5 oz glass of wine (148 mL), or one 1 oz glass of hard liquor (44 mL). Lifestyle  Take daily care of your teeth and gums.  Stay active. Exercise for at least 30 minutes on 5 or more days each week.  Do not use any products that contain nicotine or tobacco, such as cigarettes, e-cigarettes, and chewing tobacco. If you need help quitting, ask your health care provider.  If you are sexually active, practice safe sex. Use a condom or other form of birth control (contraception) in order to prevent pregnancy and STIs (sexually transmitted infections). If you plan to become pregnant, see your health care provider for a preconception visit. What's next?  Visit your health care provider once a year for a well check visit.  Ask your health care provider how often you should have your eyes and teeth checked.  Stay up to date on all vaccines. This information is not intended to replace advice given to you by your health care provider. Make sure you discuss any questions you have with your health care provider. Document Released: 07/31/2001 Document Revised: 02/13/2018 Document Reviewed: 02/13/2018 [employer]vier Patient Education  2020 [employer]  

## 2019-04-21 NOTE — Progress Notes (Signed)
Patient: Haley Bell, Female    DOB: 07/02/1990, 28 y.o.   MRN: [id number] Delsa Grana, PA-C Visit Date: 04/24/2019  Today's Provider: Delsa Grana, PA-C   Chief Complaint  Patient presents with  . Medication Refill    birth control   . Annual Exam    with pap   Subjective:   Annual physical exam:  CHE BELOW is a 28 y.o. female who presents today for complete physical exam:  Exercise/Activity:  4x a week for an hour  Diet/nutrition:  A lot of grilled chicken and veggies, drinks a lot of water  Sleep: fairly well. - wakes up to go to the bathroom  Birth control - was due to refills on OCPs, recently asked for refill cause she was out, she actually never missed a day, my colleague did refill her medications for her.  She has not had any concerns with birth control is working very well.  She denies any lower extremity edema, palpitations, shortness of breath, chest pain, elevated blood pressure. 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  She is concerned with how low it may take her to conceive since she has been on birth control for many many years.  We did discuss timeline, conception, unpredictability about how quick her and her husband may be able to conceive or her cycles after stopping her OCPs but discussed stopping medicines early next year to allow her cycles to regulate for 3 to 6 months can use condoms in that time and then begin to try and get pregnant.  I did offer referral to OB/GYN to further discuss this.  Last PAP 2017 - no abnormal paps in the past One female partner in the past year Cycles well controlled with OCPs    Pt wished to discuss acute complaints do routine f/up on chronic conditions today in addition to CPE. Advised pt of separate visit billing/coding  USPSTF grade A and B recommendations - reviewed and addressed today  Depression:  Phq 9 completed today by  patient, was reviewed by me with patient in the room, score is  negative, pt feels good PHQ 2/9 Scores 04/21/2019 09/18/2018 04/18/2018 12/10/2017  PHQ - 2 Score 0 0 0 0  PHQ- 9 Score 0 0 - 0   Depression screen [facility] 2/9 04/21/2019 09/18/2018 04/18/2018 12/10/2017 01/15/2017  Decreased Interest 0 0 0 0 0  Down, Depressed, Hopeless 0 0 0 0 0  PHQ - 2 Score 0 0 0 0 0  Altered sleeping 0 0 - 0 -  Tired, decreased energy 0 0 - 0 -  Change in appetite 0 0 - 0 -  Feeling bad or failure about yourself  0 0 - - -  Trouble concentrating 0 0 - - -  Moving slowly or fidgety/restless 0 0 - 0 -  Suicidal thoughts 0 0 - 0 -  PHQ-9 Score 0 0 - 0 -  Difficult doing work/chores Not difficult at all Not difficult at all - Not difficult at all -    Alcohol screening:   Office Visit from 04/21/2019 in [facility]  AUDIT-C Score  0      Immunizations and Health Maintenance: Health Maintenance  Topic Date Due  . TETANUS/TDAP  04/20/2020 (Originally 05/18/2018)  . HIV Screening  04/20/2020 (Originally 06/04/2007)  . PAP-Cervical Cytology Screening  04/20/2022  . PAP SMEAR-Modifier  04/20/2022  .  INFLUENZA VACCINE  Discontinued     Hep C Screening: n/a  STD testing and prevention (HIV/chl/gon/syphilis): no STD testing needed, unsure if HIV was done in the past  Intimate partner violence: feels safe  Sexual History/Pain during Intercourse:  No pain or concerns Married  Menstrual History/LMP/Abnormal Bleeding: regular with OCPs Patient's last menstrual period was 03/24/2019.  Incontinence Symptoms: none  Breast cancer:  Last Mammogram: n/a BRCA gene screening: none  Cervical cancer screening: due Family hx of cancers - breast, ovarian, uterine, colon:   none  Osteoporosis:   Discussed high calcium and vitamin D supplementation, weight bearing exercises  Skin cancer:  Hx of skin CA -  NO Discussed atypical lesions   Colorectal cancer:   colonoscopy is n/a  Lung cancer:    Low Dose CT Chest recommended if Age 7-80 years, 30 pack-year currently smoking OR have quit w/in 15years. Patient does not qualify.   Social History   Tobacco Use  . Smoking status: Never Smoker  . Smokeless tobacco: Never Used  Substance Use Topics  . Alcohol use: No     ECG:n/a  Blood pressure/Hypertension: BP Readings from Last 3 Encounters:  04/21/19 116/70  04/18/18 120/80  02/25/18 100/70    Weight/Obesity: Wt Readings from Last 3 Encounters:  04/21/19 135 lb 14.4 oz (61.6 kg)  09/18/18 135 lb 11.2 oz (61.6 kg)  04/18/18 138 lb 1.6 oz (62.6 kg)   BMI Readings from Last 3 Encounters:  04/21/19 21.93 kg/m  09/18/18 21.90 kg/m  04/18/18 22.29 kg/m     Lipids:  Lab Results  Component Value Date   CHOL 157 07/20/2015   Lab Results  Component Value Date   HDL 67 07/20/2015   Lab Results  Component Value Date   LDLCALC 78 07/20/2015   Lab Results  Component Value Date   TRIG 58 07/20/2015   No results found for: CHOLHDL No results found for: LDLDIRECT Based on the results of lipid panel his/her cardiovascular risk factor ( using [place] )  in the next 10 years is: The ASCVD Risk score Mikey Bussing DC Jr., et al., 2013) failed to calculate for the following reasons:   The 2013 ASCVD risk score is only valid for ages 38 to 70  Glucose:  Glucose  Date Value Ref Range Status  07/20/2015 85 65 - 99 mg/dL Final   Glucose, Bld  Date Value Ref Range Status  04/18/2018 91 65 - 139 mg/dL Final    Comment:    .        Non-fasting reference interval .       [facility] Visit from 04/21/2019 in [facility]  AUDIT-C Score  0     Depression: Phq 9 is  negative Depression screen [facility] 2/9 04/21/2019 09/18/2018 04/18/2018 12/10/2017 01/15/2017  Decreased Interest 0 0 0 0 0  Down, Depressed, Hopeless 0 0 0 0 0  PHQ - 2 Score 0 0 0 0 0  Altered sleeping 0 0 - 0 -  Tired, decreased energy 0 0 - 0 -  Change in appetite 0 0 - 0 -  Feeling bad or  failure about yourself  0 0 - - -  Trouble concentrating 0 0 - - -  Moving slowly or fidgety/restless 0 0 - 0 -  Suicidal thoughts 0 0 - 0 -  PHQ-9 Score 0 0 - 0 -  Difficult doing work/chores Not difficult at all Not difficult at all - Not difficult at all -  Hypertension: BP Readings from Last 3 Encounters:  04/21/19 116/70  04/18/18 120/80  02/25/18 100/70   Obesity: Wt Readings from Last 3 Encounters:  04/21/19 135 lb 14.4 oz (61.6 kg)  09/18/18 135 lb 11.2 oz (61.6 kg)  04/18/18 138 lb 1.6 oz (62.6 kg)   BMI Readings from Last 3 Encounters:  04/21/19 21.93 kg/m  09/18/18 21.90 kg/m  04/18/18 22.29 kg/m      Advanced Care Planning:  A voluntary discussion about advance care planning including the explanation and discussion of advance directives.   Discussed health care proxy and Living will, and the patient was able to identify a health care proxy husband Jeiry Birnbaum- and mother Alan Mulder Patient does not have a living will at present time.   Social History      She  reports that she has never smoked. She has never used smokeless tobacco. She reports that she does not drink alcohol or use drugs.       Social History   Socioeconomic History  . Marital status: Married    Spouse name: Shawn   . Number of children: 0  . Years of education: Not on file  . Highest education level: Bachelor's degree (e.g., BA, AB, BS)  Occupational History  . Not on file  Social Needs  . Financial resource strain: Not hard at all  . Food insecurity    Worry: Never true    Inability: Never true  . Transportation needs    Medical: No    Non-medical: No  Tobacco Use  . Smoking status: Never Smoker  . Smokeless tobacco: Never Used  Substance and Sexual Activity  . Alcohol use: No  . Drug use: No  . Sexual activity: Yes    Partners: Male    Birth control/protection: Pill  Lifestyle  . Physical activity    Days per week: 4 days    Minutes per session: 60 min  . Stress:  To some extent  Relationships  . Social connections    Talks on phone: More than three times a week    Gets together: More than three times a week    Attends religious service: More than 4 times per year    Active member of club or organization: Yes    Attends meetings of clubs or organizations: More than 4 times per year    Relationship status: Married  Other Topics Concern  . Not on file  Social History Narrative  . Not on file    Family History        Family Status  Relation Name Status  . Mother  Alive  . Father  Alive  . MGM  Alive  . MGF  Deceased       unknown  . PGM  Deceased       unknown  . PGF  Deceased       unknown        Her family history includes Diabetes in her maternal grandmother; Hypertension in her maternal grandmother; Ovarian cysts in her mother.       Family History  Problem Relation Age of Onset  . Ovarian cysts Mother   . Diabetes Maternal Grandmother   . Hypertension Maternal Grandmother     Patient Active Problem List   Diagnosis Date Noted  . Encounter for birth control pills maintenance 01/15/2017  . Fatigue 02/21/2016  . Abnormal thyroid blood test 07/28/2015  . Preventative health care 07/20/2015  . GAD (generalized anxiety disorder) 07/20/2015  .  Insomnia 07/20/2015    Past Surgical History:  Procedure Laterality Date  . TONSILLECTOMY AND ADENOIDECTOMY    . WISDOM TOOTH EXTRACTION       Current Outpatient Medications:  .  ferrous sulfate 325 (65 FE) MG EC tablet, Take 325 mg by mouth daily., Disp: , Rfl:  .  folic acid (FOLVITE) 1 MG tablet, Take 1 mg by mouth daily., Disp: , Rfl:  .  hydrOXYzine (ATARAX/VISTARIL) 10 MG tablet, Take 1 tablet (10 mg total) by mouth at bedtime as needed., Disp: 90 tablet, Rfl: 1 .  Multiple Vitamin (MULTIVITAMIN) tablet, Take 1 tablet by mouth daily., Disp: , Rfl:  .  Norgestimate-Ethinyl Estradiol Triphasic (TRI-SPRINTEC) 0.18/0.215/0.25 MG-35 MCG tablet, Take 1 tablet by mouth daily., Disp:  3 Package, Rfl: 3 .  vitamin B-12 (CYANOCOBALAMIN) 100 MCG tablet, Take 100 mcg by mouth daily., Disp: , Rfl:   No Known Allergies  Patient Care Team: Delsa Grana, PA-C as PCP - General (Family Medicine)  Review of Systems  Constitutional: Negative.  Negative for activity change, appetite change, fatigue and unexpected weight change.  HENT: Negative.   Eyes: Negative.   Respiratory: Negative.  Negative for shortness of breath.   Cardiovascular: Negative.  Negative for chest pain, palpitations and leg swelling.  Gastrointestinal: Negative.  Negative for abdominal pain and blood in stool.  Endocrine: Negative.   Genitourinary: Negative.   Musculoskeletal: Negative.  Negative for arthralgias, gait problem, joint swelling and myalgias.  Skin: Negative.  Negative for color change, pallor and rash.  Allergic/Immunologic: Negative.   Neurological: Negative.  Negative for syncope and weakness.  Hematological: Negative.   Psychiatric/Behavioral: Positive for sleep disturbance. Negative for confusion, dysphoric mood, self-injury and suicidal ideas. The patient is not nervous/anxious.      I have reviewed patient's medical history, current problem list, medication list, allergies, health maintenance, family history, social history       Objective:   Vitals:  Vitals:   04/21/19 1559  BP: 116/70  Pulse: 66  Resp: 14  Temp: 98.1 F (36.7 C)  SpO2: 99%  Weight: 135 lb 14.4 oz (61.6 kg)  Height: _0  (1.676 m)    Body mass index is 21.93 kg/m.  Physical Exam Constitutional:      General: She is not in acute distress.    Appearance: Normal appearance. She is well-developed. She is not toxic-appearing or diaphoretic.  HENT:     Head: Normocephalic and atraumatic.     Right Ear: External ear normal.     Left Ear: External ear normal.     Nose: Nose normal.     Mouth/Throat:     Pharynx: Uvula midline.  Eyes:     General: Lids are normal.     Conjunctiva/sclera: Conjunctivae  normal.     Pupils: Pupils are equal, round, and reactive to light.  Neck:     Musculoskeletal: Normal range of motion and neck supple.     Trachea: Phonation normal. No tracheal deviation.  Cardiovascular:     Rate and Rhythm: Normal rate and regular rhythm.     Pulses: Normal pulses.          Radial pulses are 2+ on the right side and 2+ on the left side.       Posterior tibial pulses are 2+ on the right side and 2+ on the left side.     Heart sounds: Normal heart sounds. No murmur. No friction rub. No gallop.   Pulmonary:  Effort: Pulmonary effort is normal. No respiratory distress.     Breath sounds: Normal breath sounds. No stridor. No wheezing, rhonchi or rales.  Chest:     Chest wall: No tenderness.  Abdominal:     General: Bowel sounds are normal. There is no distension.     Palpations: Abdomen is soft.     Tenderness: There is no abdominal tenderness. There is no guarding or rebound.  Genitourinary:    Vagina: Normal.     Cervix: Normal.     Uterus: Normal.      Adnexa: Right adnexa normal and left adnexa normal.     Comments: Pap done today, chaperone present, bimanual exam unremarkable Musculoskeletal: Normal range of motion.        General: No deformity.  Lymphadenopathy:     Cervical: No cervical adenopathy.  Skin:    General: Skin is warm and dry.     Capillary Refill: Capillary refill takes less than 2 seconds.     Coloration: Skin is not pale.     Findings: No rash.  Neurological:     Mental Status: She is alert and oriented to person, place, and time.     Motor: No abnormal muscle tone.     Gait: Gait normal.  Psychiatric:        Speech: Speech normal.        Behavior: Behavior normal.       Fall Risk: Fall Risk  04/21/2019 09/18/2018 04/18/2018 02/25/2018 01/15/2017  Falls in the past year? 0 0 0 No No  Number falls in past yr: 0 0 - - -  Injury with Fall? 0 0 - - -    Functional Status Survey: Is the patient deaf or have difficulty hearing?: No  Does the patient have difficulty seeing, even when wearing glasses/contacts?: No Does the patient have difficulty concentrating, remembering, or making decisions?: No Does the patient have difficulty walking or climbing stairs?: No Does the patient have difficulty dressing or bathing?: No Does the patient have difficulty doing errands alone such as visiting a doctor's office or shopping?: No   Assessment & Plan:    CPE completed today  . USPSTF grade A and B recommendations reviewed with patient; age-appropriate recommendations, preventive care, screening tests, etc discussed and encouraged; healthy living encouraged; see AVS for patient education given to patient  . Discussed importance of 150 minutes of physical activity weekly, AHA exercise recommendations given to pt in AVS/handout  . Discussed importance of healthy diet:  eating lean meats and proteins, avoiding trans fats and saturated fats, avoid simple sugars and excessive carbs in diet, eat 6 servings of fruit/vegetables daily and drink plenty of water and avoid sweet beverages.    . Recommended pt to do annual eye exam and routine dental exams/cleanings  . Depression, alcohol, fall screening completed as documented above and per flowsheets  . Reviewed Health Maintenance: Health Maintenance  Topic Date Due  . TETANUS/TDAP  04/20/2020 (Originally 05/18/2018)  . HIV Screening  04/20/2020 (Originally 06/04/2007)  . PAP-Cervical Cytology Screening  04/20/2022  . PAP SMEAR-Modifier  04/20/2022  . INFLUENZA VACCINE  Discontinued  Flu shot declined  . Immunizations: Immunization History  Administered Date(s) Administered  . DTaP 08/01/1992, 09/23/1992, 12/23/1992, 08/29/1993, 09/01/1996  . Hepatitis B 02-09-1992, 08/01/1992, 03/10/1993  . HiB (PRP-OMP) 08/01/1992, 09/23/1992, 12/23/1992, 08/29/1993  . IPV 08/01/1992, 09/23/1992, 08/29/1993, 09/01/1996  . MMR 08/29/1993, 09/01/1996  . Tdap 05/18/2008      ICD-10-CM   1.  Adult  general medical exam  Z00.00   2. GAD (generalized anxiety disorder)  F41.1 hydrOXYzine (ATARAX/VISTARIL) 10 MG tablet   some mild anxiety, discussed coping skills, trial of vistaril PRN during the day and can ty at night for insomnia as well  3. Insomnia, unspecified type  G47.00 hydrOXYzine (ATARAX/VISTARIL) 10 MG tablet  4. Encounter for surveillance of contraceptive pills  Z30.41 Norgestimate-Ethinyl Estradiol Triphasic (TRI-SPRINTEC) 0.18/0.215/0.25 MG-35 MCG tablet   No concerns with medication, blood pressure well controlled, will try to prescribe 97-monthsupply  5. Screening for malignant neoplasm of cervix  Z12.4 Cytology - PAP  6. Family planning education, guidance, and counseling  Z30.09    For physical today patient initiated discussion deferred any screening labs, patient is healthy, young, discussed monitoring cholesterol every 5 years, may need to monitor kidney function or thyroid function etc. with any changes in weight but she is at a healthy BMI her blood pressures well controlled no strong family history     Return in about 1 year (around 04/20/2020) for CPE .   LDelsa Grana PA-C 04/21/19 4:05 PM  [facility]

## 2019-04-23 LAB — CYTOLOGY - PAP
Comment: NEGATIVE
Diagnosis: NEGATIVE
High risk HPV: NEGATIVE

## 2019-04-24 ENCOUNTER — Encounter: Payer: Self-pay | Admitting: Family Medicine

## 2019-05-01 NOTE — Telephone Encounter (Signed)
erro  neous encounter

## 2019-06-23 ENCOUNTER — Encounter: Payer: Self-pay | Admitting: Family Medicine

## 2019-07-06 ENCOUNTER — Encounter: Payer: Self-pay | Admitting: Family Medicine

## 2019-07-20 ENCOUNTER — Encounter: Payer: Self-pay | Admitting: Family Medicine

## 2019-07-20 ENCOUNTER — Ambulatory Visit (INDEPENDENT_AMBULATORY_CARE_PROVIDER_SITE_OTHER): Payer: BC Managed Care – PPO | Admitting: Family Medicine

## 2019-07-20 VITALS — Ht 67.0 in | Wt 135.0 lb

## 2019-07-20 DIAGNOSIS — R350 Frequency of micturition: Secondary | ICD-10-CM

## 2019-07-20 DIAGNOSIS — F411 Generalized anxiety disorder: Secondary | ICD-10-CM | POA: Diagnosis not present

## 2019-07-20 DIAGNOSIS — G4700 Insomnia, unspecified: Secondary | ICD-10-CM

## 2019-07-20 MED ORDER — ZOLPIDEM TARTRATE 5 MG PO TABS: 5.0000 mg | ORAL_TABLET | Freq: Every evening | ORAL | 0 refills | Status: DC | PRN

## 2019-07-20 MED ORDER — BUSPIRONE HCL 5 MG PO TABS: 5.0000 mg | ORAL_TABLET | Freq: Two times a day (BID) | ORAL | 1 refills | Status: DC

## 2019-07-20 MED ORDER — TRAZODONE HCL 50 MG PO TABS: 25.0000 mg | ORAL_TABLET | Freq: Every evening | ORAL | 3 refills | Status: DC | PRN

## 2019-07-20 NOTE — Patient Instructions (Addendum)
Plan for insomnia and anxiety:  Try Buspar twice a day Trial of trazodone   OR   ambien at bedtime   - do not take together Bladder training - see below - decrease drinking after about 5 - 6pm, kegels, train yourself to hold urine longer - etc - see below  Info about all three medications:  Both Ambien and trazodone are Category C in pregnancy - usually this means no direct studies done in humans, and no evidence of harm in animal trials to make it contraindicated - and then usually you can use if the benefits of the meds outweigh the risks.    Since you are not currently pregnant - I would recommend trying both of these.  I do feel trazodone is effective and safe for long-term use.  I feel Ambien is more sedating and there is higher risk of dependency or side effects, but I do feel it is safe for short-term use or for severe symptoms when you just need to catch up on sleep a few days a month.  I do prefer to not use Ambien for long-term daily use.  I also wanted to recommend that you try BuSpar which is a medication that helps with generalized anxiety, I would try this at the lowest dose - 5 mg twice a day and it could help calm/treat the underlying cause of insomnia  A lot of my patients with some generalized anxiety or increased stress or work anxiety have a great result with BuSpar sometimes even once a day in the morning they will feel less anxiety symptoms throughout the day and in the evening.  Please consider giving BuSpar a try- it is the safest in pregnancy - Category B.  I strongly recommend you give it a try since it would be the easiest to continue should you happen to get pregnant  Also continue to try good sleep hygiene, bladder training, and consider finding a therapist to help.  Often insomnia is treated by psychiatry, psychology or neurology.     Buspar: Pregnancy: Teratogenic Effects Pregnancy Category B: No fertility impairment or fetal damage was observed in  reproduction studies performed in rats and rabbits at buspirone doses of approximately 30 times the maximum recommended human dose. In humans, however, adequate and well-controlled studies during pregnancy have not been performed. Because animal reproduction studies are not always predictive of human response, this drug should be used during pregnancy only if clearly needed.    Ambien in pregnancy: Pregnancy Category C There are no adequate and well-controlled studies in pregnant women. Ambien should be used during pregnancy only if the potential benefit outweighs the potential risk to the fetus. Oral studies of zolpidem in pregnant rats and rabbits showed adverse effects on the development of offspring only at doses greater than the maximum recommended human dose (MRHD of 10 mg/day). These doses were also maternally toxic in animals. A teratogenic effect was not observed in these studies. Administration to pregnant rats during the period of organogenesis produced dose-related maternal toxicity and decreases in fetal skull ossification at doses 25 to 125 times the MRHD. The no-effect dose for embryo-fetal toxicity was between 4 and 5 times the MRHD. Treatment of pregnant rabbits during organogenesis resulted in maternal toxicity at all doses studied and increased post-implantation embryo-fetal loss and under-ossification of fetal sternebrae at the highest dose (over 35 times the MRHD). The no-effect level for embryofetal toxicity was between 9 and 10 times the MRHD. Administration to rats during the latter  part of pregnancy and throughout lactation produced maternal toxicity and decreased pup growth and survival at doses approximately 25 to 125 times the MRHD. The no-effect dose for offspring toxicity was between 4 and 5 times the MRHD.  Studies to assess the effects on children whose mothers took zolpidem during pregnancy have not been conducted. There is a published case report  documenting the presence of zolpidem in human umbilical cord blood. Children born of mothers taking sedative/hypnotic drugs may be at some risk for withdrawal symptoms from the drug during the postnatal period. In addition, neonatal flaccidity has been reported in infants born of mothers who received sedative/hypnotic drugs during pregnancy   Trazodone Pregnancy Category C Trazodone hydrochloride has been shown to cause increased fetal resorption and other adverse effects on the fetus in the rat when given at dose levels approximately 6 to 9 times the maximum recommended human dose (MRHD) of 400 mg/day on mg/m2 in adolescents. There was also an increase in congenital anomalies in the rabbit at approximately 6 to 17 times the MRHD on mg/m2 basis in adolescents. There are no adequate and well-controlled studies in pregnant women. Trazodone hydrochloride should be used during pregnancy only if the potential benefit justifies the potential risk to the fetus.  Animal Data No teratogenic effects were observed when trazodone was given to pregnant rats and rabbits during the period of organogenesis at oral doses up to 450 mg/kg/day. This dose is 9 and 17 times, in rats and rabbits, respectively, the maximum recommended human dose (MRHD) of 400 mg/day on mg/m2 basis in adolescents. Increased fetal resorption and other adverse effects on the fetus in rats at 6 to 9 times the MRHD and increase in congenital anomalies in rabbits at 6 to 17 times the MRHD on mg/m2 basis in adolescents were observed.     Insomnia Insomnia is a sleep disorder that makes it difficult to fall asleep or stay asleep. Insomnia can cause fatigue, low energy, difficulty concentrating, mood swings, and poor performance at work or school. There are three different ways to classify insomnia:  Difficulty falling asleep.  Difficulty staying asleep.  Waking up too early in the morning. Any type of insomnia can be long-term (chronic) or  short-term (acute). Both are common. Short-term insomnia usually lasts for three months or less. Chronic insomnia occurs at least three times a week for longer than three months. What are the causes? Insomnia may be caused by another condition, situation, or substance, such as:  Anxiety.  Certain medicines.  Gastroesophageal reflux disease (GERD) or other gastrointestinal conditions.  Asthma or other breathing conditions.  Restless legs syndrome, sleep apnea, or other sleep disorders.  Chronic pain.  Menopause.  Stroke.  Abuse of alcohol, tobacco, or illegal drugs.  Mental health conditions, such as depression.  Caffeine.  Neurological disorders, such as Alzheimer's disease.  An overactive thyroid (hyperthyroidism). Sometimes, the cause of insomnia may not be known. What increases the risk? Risk factors for insomnia include:  Gender. Women are affected more often than men.  Age. Insomnia is more common as you get older.  Stress.  Lack of exercise.  Irregular work schedule or working night shifts.  Traveling between different time zones.  Certain medical and mental health conditions. What are the signs or symptoms? If you have insomnia, the main symptom is having trouble falling asleep or having trouble staying asleep. This may lead to other symptoms, such as:  Feeling fatigued or having low energy.  Feeling nervous about going to sleep.  Not feeling rested in the morning.  Having trouble concentrating.  Feeling irritable, anxious, or depressed. How is this diagnosed? This condition may be diagnosed based on:  Your symptoms and medical history. Your health care provider may ask about: ? Your sleep habits. ? Any medical conditions you have. ? Your mental health.  A physical exam. How is this treated? Treatment for insomnia depends on the cause. Treatment may focus on treating an underlying condition that is causing insomnia. Treatment may also  include:  Medicines to help you sleep.  Counseling or therapy.  Lifestyle adjustments to help you sleep better. Follow these instructions at home: Eating and drinking   Limit or avoid alcohol, caffeinated beverages, and cigarettes, especially close to bedtime. These can disrupt your sleep.  Do not eat a large meal or eat spicy foods right before bedtime. This can lead to digestive discomfort that can make it hard for you to sleep. Sleep habits   Keep a sleep diary to help you and your health care provider figure out what could be causing your insomnia. Write down: ? When you sleep. ? When you wake up during the night. ? How well you sleep. ? How rested you feel the next day. ? Any side effects of medicines you are taking. ? What you eat and drink.  Make your bedroom a dark, comfortable place where it is easy to fall asleep. ? Put up shades or blackout curtains to block light from outside. ? Use a white noise machine to block noise. ? Keep the temperature cool.  Limit screen use before bedtime. This includes: ? Watching TV. ? Using your smartphone, tablet, or computer.  Stick to a routine that includes going to bed and waking up at the same times every day and night. This can help you fall asleep faster. Consider making a quiet activity, such as reading, part of your nighttime routine.  Try to avoid taking naps during the day so that you sleep better at night.  Get out of bed if you are still awake after 15 minutes of trying to sleep. Keep the lights down, but try reading or doing a quiet activity. When you feel sleepy, go back to bed. General instructions  Take over-the-counter and prescription medicines only as told by your health care provider.  Exercise regularly, as told by your health care provider. Avoid exercise starting several hours before bedtime.  Use relaxation techniques to manage stress. Ask your health care provider to suggest some techniques that may work  well for you. These may include: ? Breathing exercises. ? Routines to release muscle tension. ? Visualizing peaceful scenes.  Make sure that you drive carefully. Avoid driving if you feel very sleepy.  Keep all follow-up visits as told by your health care provider. This is important. Contact a health care provider if:  You are tired throughout the day.  You have trouble in your daily routine due to sleepiness.  You continue to have sleep problems, or your sleep problems get worse. Get help right away if:  You have serious thoughts about hurting yourself or someone else. If you ever feel like you may hurt yourself or others, or have thoughts about taking your own life, get help right away. You can go to your nearest emergency department or call:  Your local emergency services (911 in the [place]  A suicide crisis helpline, such as the [facility] at 629-444-0077. This is open 24 hours a day. Summary  Insomnia is  a sleep disorder that makes it difficult to fall asleep or stay asleep.  Insomnia can be long-term (chronic) or short-term (acute).  Treatment for insomnia depends on the cause. Treatment may focus on treating an underlying condition that is causing insomnia.  Keep a sleep diary to help you and your health care provider figure out what could be causing your insomnia. This information is not intended to replace advice given to you by your health care provider. Make sure you discuss any questions you have with your health care provider. Document Revised: 05/17/2017 Document Reviewed: 03/14/2017 Elsevier Patient Education  2020 [employer] Inc[employer]    Managing Anxiety, Adult After being diagnosed with an anxiety disorder, you may be relieved to know why you have felt or behaved a certain way. You may also feel overwhelmed about the treatment ahead and what it will mean for your life. With care and support, you can manage this condition and recover from  it. How to manage lifestyle changes Managing stress and anxiety  Stress is your body's reaction to life changes and events, both good and bad. Most stress will last just a few hours, but stress can be ongoing and can lead to more than just stress. Although stress can play a major role in anxiety, it is not the same as anxiety. Stress is usually caused by something external, such as a deadline, test, or competition. Stress normally passes after the triggering event has ended.  Anxiety is caused by something internal, such as imagining a terrible outcome or worrying that something will go wrong that will devastate you. Anxiety often does not go away even after the triggering event is over, and it can become long-term (chronic) worry. It is important to understand the differences between stress and anxiety and to manage your stress effectively so that it does not lead to an anxious response. Talk with your health care provider or a counselor to learn more about reducing anxiety and stress. He or she may suggest tension reduction techniques, such as:  Music therapy. This can include creating or listening to music that you enjoy and that inspires you.  Mindfulness-based meditation. This involves being aware of your normal breaths while not trying to control your breathing. It can be done while sitting or walking.  Centering prayer. This involves focusing on a word, phrase, or sacred image that means something to you and brings you peace.  Deep breathing. To do this, expand your stomach and inhale slowly through your nose. Hold your breath for 3-5 seconds. Then exhale slowly, letting your stomach muscles relax.  Self-talk. This involves identifying thought patterns that lead to anxiety reactions and changing those patterns.  Muscle relaxation. This involves tensing muscles and then relaxing them. Choose a tension reduction technique that suits your lifestyle and personality. These techniques take time  and practice. Set aside 5-15 minutes a day to do them. Therapists can offer counseling and training in these techniques. The training to help with anxiety may be covered by some insurance plans. Other things you can do to manage stress and anxiety include:  Keeping a stress/anxiety diary. This can help you learn what triggers your reaction and then learn ways to manage your response.  Thinking about how you react to certain situations. You may not be able to control everything, but you can control your response.  Making time for activities that help you relax and not feeling guilty about spending your time in this way.  Visual imagery and yoga can help  you stay calm and relax.  Medicines Medicines can help ease symptoms. Medicines for anxiety include:  Anti-anxiety drugs.  Antidepressants. Medicines are often used as a primary treatment for anxiety disorder. Medicines will be prescribed by a health care provider. When used together, medicines, psychotherapy, and tension reduction techniques may be the most effective treatment. Relationships Relationships can play a big part in helping you recover. Try to spend more time connecting with trusted friends and family members. Consider going to couples counseling, taking family education classes, or going to family therapy. Therapy can help you and others better understand your condition. How to recognize changes in your anxiety Everyone responds differently to treatment for anxiety. Recovery from anxiety happens when symptoms decrease and stop interfering with your daily activities at home or work. This may mean that you will start to:  Have better concentration and focus. Worry will interfere less in your daily thinking.  Sleep better.  Be less irritable.  Have more energy.  Have improved memory. It is important to recognize when your condition is getting worse. Contact your health care provider if your symptoms interfere with home or work  and you feel like your condition is not improving. Follow these instructions at home: Activity  Exercise. Most adults should do the following: ? Exercise for at least 150 minutes each week. The exercise should increase your heart rate and make you sweat (moderate-intensity exercise). ? Strengthening exercises at least twice a week.  Get the right amount and quality of sleep. Most adults need 7-9 hours of sleep each night. Lifestyle   Eat a healthy diet that includes plenty of vegetables, fruits, whole grains, low-fat dairy products, and lean protein. Do not eat a lot of foods that are high in solid fats, added sugars, or salt.  Make choices that simplify your life.  Do not use any products that contain nicotine or tobacco, such as cigarettes, e-cigarettes, and chewing tobacco. If you need help quitting, ask your health care provider.  Avoid caffeine, alcohol, and certain over-the-counter cold medicines. These may make you feel worse. Ask your pharmacist which medicines to avoid. General instructions  Take over-the-counter and prescription medicines only as told by your health care provider.  Keep all follow-up visits as told by your health care provider. This is important. Where to find support You can get help and support from these sources:  Self-help groups.  Online and [employer].  A trusted spiritual leader.  Couples counseling.  Family education classes.  Family therapy. Where to find more information You may find that joining a support group helps you deal with your anxiety. The following sources can help you locate counselors or support groups near you:  Mental Health America: www.mentalhealthamerica.net  Anxiety and Depression Association of [place] (ADAA): ProgramCam.de  [employer] Mental [employer]ness (NAMI): www.nami.org Contact a health care provider if you:  Have a hard time staying focused or finishing daily tasks.  Spend many hours a  day feeling worried about everyday life.  Become exhausted by worry.  Start to have headaches, feel tense, or have nausea.  Urinate more than normal.  Have diarrhea. Get help right away if you have:  A racing heart and shortness of breath.  Thoughts of hurting yourself or others. If you ever feel like you may hurt yourself or others, or have thoughts about taking your own life, get help right away. You can go to your nearest emergency department or call:  Your local emergency services (911 in the  [place]  A suicide crisis helpline, such as the National Suicide Prevention Lifeline at (816) 126-0964. This is open 24 hours a day. Summary  Taking steps to learn and use tension reduction techniques can help calm you and help prevent triggering an anxiety reaction.  When used together, medicines, psychotherapy, and tension reduction techniques may be the most effective treatment.  Family, friends, and partners can play a big part in helping you recover from an anxiety disorder. This information is not intended to replace advice given to you by your health care provider. Make sure you discuss any questions you have with your health care provider. Document Revised: 11/04/2018 Document Reviewed: 11/04/2018 Elsevier Patient Education  2020 Elsevier Inc.    Urinary Frequency, Adult Urinary frequency means urinating more often than usual. You may urinate every 1-2 hours even though you drink a normal amount of fluid and do not have a bladder infection or condition. Although you urinate more often than normal, the total amount of urine produced in a day is normal. With urinary frequency, you may have an urgent need to urinate often. The stress and anxiety of needing to find a bathroom quickly can make this urge worse. This condition may go away on its own or you may need treatment at home. Home treatment may include bladder training, exercises, taking medicines, or making changes to your  diet.  Follow these instructions at home: Bladder health   Keep a bladder diary if told by your health care provider. Keep track of: ? What you eat and drink. ? How often you urinate. ? How much you urinate.  Follow a bladder training program if told by your health care provider. This may include: ? Learning to delay going to the bathroom. ? Double urinating (voiding). This helps if you are not completely emptying your bladder. ? Scheduled voiding.  Do Kegel exercises as told by your health care provider. Kegel exercises strengthen the muscles that help control urination, which may help the condition.  Eating and drinking  If told by your health care provider, make diet changes, such as: ? Avoiding caffeine. ? Drinking fewer fluids, especially alcohol. ? Not drinking in the evening. ? Avoiding foods or drinks that may irritate the bladder. These include coffee, tea, soda, artificial sweeteners, citrus, tomato-based foods, and chocolate. ? Eating foods that help prevent or ease constipation. Constipation can make this condition worse. Your health care provider may recommend that you:  Drink enough fluid to keep your urine pale yellow.  Take over-the-counter or prescription medicines.  Eat foods that are high in fiber, such as beans, whole grains, and fresh fruits and vegetables.  Limit foods that are high in fat and processed sugars, such as fried or sweet foods.  General instructions  Take over-the-counter and prescription medicines only as told by your health care provider.  Keep all follow-up visits as told by your health care provider. This is important.  Contact a health care provider if:  You start urinating more often.  You feel pain or irritation when you urinate.  You notice blood in your urine.  Your urine looks cloudy.  You develop a fever.  You begin vomiting.  Get help right away if:  You are unable to urinate.  Summary  Urinary frequency means  urinating more often than usual. With urinary frequency, you may urinate every 1-2 hours even though you drink a normal amount of fluid and do not have a bladder infection or other bladder condition.  Your health  care provider may recommend that you keep a bladder diary, follow a bladder training program, or make dietary changes.  If told by your health care provider, do Kegel exercises to strengthen the muscles that help control urination.  Take over-the-counter and prescription medicines only as told by your health care provider.  Contact a health care provider if your symptoms do not improve or get worse. This information is not intended to replace advice given to you by your health care provider. Make sure you discuss any questions you have with your health care provider. Document Revised: 12/12/2017 Document Reviewed: 12/12/2017 Elsevier Patient Education  2020 [employer]

## 2019-07-20 NOTE — Progress Notes (Signed)
Name: Haley Bell   MRN: [id number]    DOB: 12/27/1990   Date:07/20/2019       Progress Note  Subjective:    Chief Complaint  Chief Complaint  Patient presents with  . Follow-up  . Anxiety    not helping, not helping with sleep    I connected with  Wilbert Schouten Dekay  on 07/20/19 at  3:20 PM EST by a video enabled telemedicine application and verified that I am speaking with the correct person using two identifiers.  I discussed the limitations of evaluation and management by telemedicine and the availability of in person appointments. The patient expressed understanding and agreed to proceed. Staff also discussed with the patient that there may be a patient responsible charge related to this service. Patient Location: home Provider Location: [facility] Additional Individuals present: none  Anxiety Presents for follow-up visit. Symptoms include insomnia and nervous/anxious behavior. Symptoms occur most days. The severity of symptoms is mild. The quality of sleep is fair. Nighttime awakenings: several.    Insomnia Primary symptoms: fragmented sleep, difficulty falling asleep, frequent awakening.  Episode onset: 1.5 years. The onset quality is undetermined. The problem occurs nightly. The problem has been gradually worsening since onset. The symptoms are aggravated by work stress and anxiety. How many beverages per day that contain caffeine: 0 - 1.  Drinking: none. Nothing relieves the symptoms. Past treatments include medication (hydroxyzine). Improvement on treatment: hydrozyxine helps a little only a few times, then did not. How long after going to bed to you fall asleep: over an hour.   PMH includes: no hypertension, no depression, no restless leg syndrome, work related stressors, no chronic pain, no apnea. Prior diagnostic workup includes:  No prior workup.    Insomnia: Trying to go to bed 9:30 - 10 pm, takes her about 1-3 hours to get to sleep, she wakes up 5:30 am weekdays.   She does not get on her phone or watch movies immediately before bed she keeps her from very dark, no light, no screens, it is also very quiet.  Even though her body and eyes are tired her mind is very busy active and has trouble falling asleep.  She also states that she wakes from sleep multiple times a night to urinate she "thinks she has a small bladder" even decreasing when she drinks later in the day has not made a difference.  She has looked up insomnia and done most sleep hygiene principles without much improvement.  She does not drink coffee at all does not have other energy drinks or soda usually only drinks water.  She is also exercising 3x a week states she will go to the gym and burn 600 cal and still cannot fall asleep those evenings   Depression screen [facility] 2/9 07/20/2019 04/21/2019 09/18/2018  Decreased Interest 0 0 0  Down, Depressed, Hopeless 0 0 0  PHQ - 2 Score 0 0 0  Altered sleeping 1 0 0  Tired, decreased energy 0 0 0  Change in appetite 0 0 0  Feeling bad or failure about yourself  0 0 0  Trouble concentrating 0 0 0  Moving slowly or fidgety/restless 0 0 0  Suicidal thoughts 0 0 0  PHQ-9 Score 1 0 0  Difficult doing work/chores Not difficult at all Not difficult at all Not difficult at all   GAD 7 : Generalized Anxiety Score 07/20/2019 09/18/2018  Nervous, Anxious, on Edge 1 2  Control/stop worrying 0 0  Worry too much - different things 1 0  Trouble relaxing 1 2  Restless 0 0  Easily annoyed or irritable 1 0  Afraid - awful might happen 0 0  Total GAD 7 Score 4 4  Anxiety Difficulty Not difficult at all Not difficult at all   No change to GAD, still sx of nervousness, anxiousness, excessive worry, trouble relaxing and occasional irritation or annoyance. PHQ slight change - only altered sleeping which is not new, mood overall good.    Patient Active Problem List   Diagnosis Date Noted  . Encounter for birth control pills maintenance 01/15/2017  . Fatigue 02/21/2016  .  Abnormal thyroid blood test 07/28/2015  . Preventative health care 07/20/2015  . GAD (generalized anxiety disorder) 07/20/2015  . Insomnia 07/20/2015    Social History   Tobacco Use  . Smoking status: Never Smoker  . Smokeless tobacco: Never Used  Substance Use Topics  . Alcohol use: No     Current Outpatient Medications:  .  ferrous sulfate 325 (65 FE) MG EC tablet, Take 325 mg by mouth daily., Disp: , Rfl:  .  folic acid (FOLVITE) 1 MG tablet, Take 1 mg by mouth daily., Disp: , Rfl:  .  Multiple Vitamin (MULTIVITAMIN) tablet, Take 1 tablet by mouth daily., Disp: , Rfl:  .  Norgestimate-Ethinyl Estradiol Triphasic (TRI-SPRINTEC) 0.18/0.215/0.25 MG-35 MCG tablet, Take 1 tablet by mouth daily., Disp: 3 Package, Rfl: 3 .  vitamin B-12 (CYANOCOBALAMIN) 100 MCG tablet, Take 100 mcg by mouth daily., Disp: , Rfl:   No Known Allergies  I personally reviewed active problem list, medication list, allergies, family history, social history, health maintenance, notes from last encounter, lab results, imaging with the patient/caregiver today.   Review of Systems  Constitutional: Negative.   HENT: Negative.   Eyes: Negative.   Respiratory: Negative.  Negative for apnea.   Cardiovascular: Negative.   Gastrointestinal: Negative.   Endocrine: Negative.   Genitourinary: Negative.   Musculoskeletal: Negative.   Skin: Negative.   Allergic/Immunologic: Negative.   Neurological: Negative.   Hematological: Negative.   Psychiatric/Behavioral: Negative for depression. The patient is nervous/anxious and has insomnia.   All other systems reviewed and are negative.   Objective:   Virtual encounter, vitals limited, only able to obtain the following Today's Vitals   07/20/19 1535  Weight: 135 lb (61.2 kg)  Height: 5\' 7"  (1.702 m)   Body mass index is 21.14 kg/m. Nursing Note and Vital Signs reviewed.  Physical Exam Vitals and nursing note reviewed.  Constitutional:      General: She is  not in acute distress.    Appearance: Normal appearance. She is well-developed. She is not ill-appearing, toxic-appearing or diaphoretic.  HENT:     Head: Normocephalic and atraumatic.  Eyes:     General:        Right eye: No discharge.        Left eye: No discharge.     Conjunctiva/sclera: Conjunctivae normal.  Neck:     Trachea: No tracheal deviation.  Cardiovascular:     Rate and Rhythm: Normal rate.  Pulmonary:     Effort: Pulmonary effort is normal. No respiratory distress.     Breath sounds: No stridor.  Skin:    Coloration: Skin is not jaundiced or pale.     Findings: No rash.  Neurological:     Mental Status: She is alert.  Psychiatric:        Mood and Affect: Mood normal.  Behavior: Behavior normal.     PE limited by telephone encounter  No results found for this or any previous visit (from the past 72 hour(s)).  Assessment and Plan:     ICD-10-CM   1. Insomnia, unspecified type  G47.00 zolpidem (AMBIEN) 5 MG tablet    traZODone (DESYREL) 50 MG tablet   if no improvement with buspar and other sleep hygeine and bladder training, then she can try trazodone or ambien - discussed meds s/e, risks, preg category  2. GAD (generalized anxiety disorder)  F41.1 busPIRone (BUSPAR) 5 MG tablet    traZODone (DESYREL) 50 MG tablet   Buspar trial  3. Urinary frequency  R35.0    and nocturia, interfering with sleep.  Bladder training, decreased liquids after 5pm     1 Month f/up on which meds work well so I can refill I did explain how I am more comfortable with trazodone for daily and longer term use I only feel comfortable with Ambien for very intermittent and short-term use And with pregnancy categories and patient's desire to conceive soon have recently stopped birth control I do believe BuSpar would be the safest with pregnancy category B.  Extensive amount of med information, pregnancy categories, and handouts given for insomnia and sleep hygiene, urinary  frequency bladder training, generalized anxiety coping skills and exercises   - I discussed the assessment and treatment plan with the patient. The patient was provided an opportunity to ask questions and all were answered. The patient agreed with the plan and demonstrated an understanding of the instructions.  I provided 20 minutes of non-face-to-face time during this encounter.  Delsa Grana, PA-C 07/20/19 4:24 PM

## 2019-08-15 ENCOUNTER — Ambulatory Visit: Payer: BC Managed Care – PPO

## 2019-09-10 ENCOUNTER — Telehealth (INDEPENDENT_AMBULATORY_CARE_PROVIDER_SITE_OTHER): Payer: BC Managed Care – PPO | Admitting: Family Medicine

## 2019-09-10 ENCOUNTER — Encounter: Payer: Self-pay | Admitting: Family Medicine

## 2019-09-10 VITALS — Ht 68.0 in | Wt 130.0 lb

## 2019-09-10 DIAGNOSIS — R519 Headache, unspecified: Secondary | ICD-10-CM | POA: Diagnosis not present

## 2019-09-10 MED ORDER — SUMATRIPTAN SUCCINATE 25 MG PO TABS: 25.0000 mg | ORAL_TABLET | ORAL | 1 refills | Status: DC | PRN

## 2019-09-10 NOTE — Progress Notes (Signed)
Name: Haley Bell   MRN: [id number]    DOB: 03-23-1991   Date:09/10/2019       Progress Note  Subjective:    Chief Complaint  Chief Complaint  Patient presents with  . Headache    everyday for the past 3 weeks    I connected with  Leitha Bleak  on 09/10/19 at  3:20 PM EDT by a video enabled telemedicine application and verified that I am speaking with the correct person using two identifiers.  I discussed the limitations of evaluation and management by telemedicine and the availability of in person appointments. The patient expressed understanding and agreed to proceed. Staff also discussed with the patient that there may be a patient responsible charge related to this service. Patient Location: home Provider Location: [facility] Additional Individuals present: none  HPI   Lasix eye surgery Jan 6th with f/up 2x, she had onset of HA's after her procedure, her follow ups have been normal with vision testing but shes having HA's for the past 3 weeks she's been having HA's every day.  She is on screens for  9 hours a day, HA is behind both eyes, some days is from when she wakes up to the time she goes to bed.  No significant change from screen time  Gym MWF her head feels better  Glasses - every day before lasix, she doesn't know if they had a protective layer on it or not?  HA's not preventing her from sleeping  HA sometime more mild, but becomes more severe, throbbing, usually b/l eyes sometimes only the right eye.    Eye pain today 4/10 Tried treating with ibuprofen OTC she was taking every day advil for headaches dual action- she was taking 2 in morning and 2      Patient Active Problem List   Diagnosis Date Noted  . Encounter for birth control pills maintenance 01/15/2017  . Fatigue 02/21/2016  . Abnormal thyroid blood test 07/28/2015  . Preventative health care 07/20/2015  . GAD (generalized anxiety disorder) 07/20/2015  . Insomnia 07/20/2015    Social History    Tobacco Use  . Smoking status: Never Smoker  . Smokeless tobacco: Never Used  Substance Use Topics  . Alcohol use: No     Current Outpatient Medications:  .  busPIRone (BUSPAR) 5 MG tablet, Take 1 tablet (5 mg total) by mouth 2 (two) times daily. For GAD and insomnia sx, Disp: 120 tablet, Rfl: 1 .  ferrous sulfate 325 (65 FE) MG EC tablet, Take 325 mg by mouth daily., Disp: , Rfl:  .  folic acid (FOLVITE) 1 MG tablet, Take 1 mg by mouth daily., Disp: , Rfl:  .  Multiple Vitamin (MULTIVITAMIN) tablet, Take 1 tablet by mouth daily., Disp: , Rfl:  .  traZODone (DESYREL) 50 MG tablet, Take 0.5-1 tablets (25-50 mg total) by mouth at bedtime as needed for sleep., Disp: 30 tablet, Rfl: 3 .  vitamin B-12 (CYANOCOBALAMIN) 100 MCG tablet, Take 100 mcg by mouth daily., Disp: , Rfl:  .  Norgestimate-Ethinyl Estradiol Triphasic (TRI-SPRINTEC) 0.18/0.215/0.25 MG-35 MCG tablet, Take 1 tablet by mouth daily. (Patient not taking: Reported on 09/10/2019), Disp: 3 Package, Rfl: 3 .  zolpidem (AMBIEN) 5 MG tablet, Take 1 tablet (5 mg total) by mouth at bedtime as needed for sleep (insomnia). Insomnia zolpidem trial (Patient not taking: Reported on 09/10/2019), Disp: 10 tablet, Rfl: 0  No Known Allergies  I personally reviewed active problem list, medication list, allergies,  family history, social history, health maintenance, notes from last encounter, lab results, imaging with the patient/caregiver today.   Review of Systems  Constitutional: Negative.  Negative for activity change, appetite change, chills, diaphoresis, fatigue and fever.  HENT: Negative.   Eyes: Negative.  Negative for photophobia, discharge and redness.  Respiratory: Negative.   Cardiovascular: Negative.  Negative for chest pain, palpitations and leg swelling.  Gastrointestinal: Negative.  Negative for nausea and vomiting.  Endocrine: Negative.  Negative for cold intolerance, heat intolerance, polydipsia, polyphagia and polyuria.   Genitourinary: Negative.   Musculoskeletal: Negative.  Negative for back pain and neck pain.  Skin: Negative.   Allergic/Immunologic: Negative.   Neurological: Positive for headaches. Negative for dizziness, tremors, syncope, weakness, light-headedness and numbness.  Hematological: Negative.   Psychiatric/Behavioral: Negative.   All other systems reviewed and are negative.     Objective:   Virtual encounter, vitals limited, only able to obtain the following Today's Vitals   09/10/19 1523 09/10/19 1524  Weight: 130 lb (59 kg)   Height: 5\' 8"  (1.727 m)   PainSc:  4    Body mass index is 19.77 kg/m. Nursing Note and Vital Signs reviewed.  Physical Exam Vitals and nursing note reviewed.  Constitutional:      General: She is not in acute distress.    Appearance: Normal appearance. She is well-developed. She is not ill-appearing, toxic-appearing or diaphoretic.     Comments: Well-appearing pleasant young female  HENT:     Head: Normocephalic and atraumatic.  Eyes:     General:        Right eye: No discharge.        Left eye: No discharge.     Conjunctiva/sclera: Conjunctivae normal.     Comments: Patient not wearing prescription lenses, does not appear to have any photophobia eye movements appear to be in sync, no periorbital edema no conjunctivitis known discharge noted  Neck:     Trachea: No tracheal deviation.  Cardiovascular:     Rate and Rhythm: Normal rate.  Pulmonary:     Effort: Pulmonary effort is normal. No respiratory distress.     Breath sounds: No stridor.  Skin:    Coloration: Skin is not jaundiced or pale.     Findings: No rash.  Neurological:     Mental Status: She is alert.     Cranial Nerves: No dysarthria or facial asymmetry.     Comments: Patient has symmetrical face symmetrical smile no droop no slurred speech, she is able to walk around her home on phone for virtual encounter and gait and ambulation was normal appearing, moves both extremities with  normal coordination  Psychiatric:        Mood and Affect: Mood normal.        Behavior: Behavior normal.     PE limited by telephone encounter  No results found for this or any previous visit (from the past 72 hour(s)).  Assessment and Plan:     ICD-10-CM   1. Nonintractable headache, unspecified chronicity pattern, unspecified headache type  R51.9 SUMAtriptan (IMITREX) 25 MG tablet    Patient with recent Lasix surgery followed by new onset daily headache usually is located behind her eyes sometimes is unilateral to the right side has been fairly constant for the past 3 weeks but will sometimes be much more severe and throbbing.  When it started she began taking ibuprofen and other over-the-counter medications such as Aleve -reports using multiple times a day every single day for most  of the last 3 weeks.  She may have some medication overuse headache or when trying to change medications may be having some analgesic rebound headache?   He does have significant amount of screen time now without her prescription lenses she may be having some headaches due to her change in vision and lack of glasses or barrier between her eyes and screens all day long.  She does have complete resolution of her headache when she goes to the gym and works out 3 days a week otherwise she is had really constant headaches from the time she wakes up to the time she goes to bed headaches or not waking her up at night, there was no acute or sudden onset, no history of migraines no history of aneurysms in the family.  She has no concerning associated red flags no neck pain tension stiffness no fever chills sweats nausea vomiting, no focal weakness numbness tingling no facial droop slurred speech confusion dizziness.   Plan is going to be to start Imitrex see if it helps decrease her headache she is going to start skipping doses of her over-the-counter medications are alternating to try and make sure she is not causing  medication overuse headache or analgesic rebound headaches, is going to try and take more frequent breaks from screens throughout the day, increase her exercise, make sure she is hydrating and resting well and school to keep a headache journal and follow-up in the next 1 to 2 weeks   Plan was written out for the patient and sent to her on her after visit summary through [place], including the following: Try imitrex to see if it can help get rid of your head ache:  Imitrex med instructions.  Can use 25-100 mg by mouth once at the onset of headache, and if it doesn't resolve can take a second dose one hour later.  Max dose in 24 hours is 200 mg   Try to space out other over the counter medications to be less frequently used - alternate kinds of meds, push fluids, get plenty of sleep, find ways to de-stress - maybe increase how much you're exercising and taking breaks from screens.    With your recent vision changes and procedures - see if resting your eyes from screens with 5 to 10-minute breaks several times throughout the day  or if buying blue screen glasses helps decrease your headache and eye pain  See the info below - See the lowest sections below on General information - will give instructions on what to do and look for - how to keep headache journal Also see the bottom large bold section on concerning signs and symptoms that need immediate evaluation.  Follow up in 1-2 weeks for recheck and in person neuro evaluation  If you are having worsening headaches when trying to get off of over-the-counter medications it will be helpful for you to come into the clinic so we can do some injections and start some medicines such as steroids and nausea medicine to help while you get off the over-the-counter meds   -Red flags and when to present for emergency care or RTC including fever >101.83F, chest pain, shortness of breath, new/worsening/un-resolving symptoms, reviewed with patient at time of visit.  Follow up and care instructions discussed and provided in AVS. - I discussed the assessment and treatment plan with the patient. The patient was provided an opportunity to ask questions and all were answered. The patient agreed with the plan and demonstrated an  understanding of the instructions.  I provided 30+ minutes of non-face-to-face time during this encounter.  Danelle Berry, PA-C 09/10/19 3:40 PM

## 2019-09-10 NOTE — Patient Instructions (Addendum)
Try imitrex to see if it can help get rid of your head ache:  Imitrex med instructions.  Can use 25-100 mg by mouth once at the onset of headache, and if it doesn't resolve can take a second dose one hour later.  Max dose in 24 hours is 200 mg   Try to space out other over the counter medications to be less frequently used - alternate kinds of meds, push fluids, get plenty of sleep, find ways to de-stress - maybe increase how much you're exercising and taking breaks from screens.    With your recent vision changes and procedures - see if resting your eyes from screens with 5 to 10-minute breaks several times throughout the day  or if buying blue screen glasses helps decrease your headache and eye pain  See the info below - See the lowest sections below on General information - will give instructions on what to do and look for - how to keep headache journal Also see the bottom large bold section on concerning signs and symptoms that need immediate evaluation.  Follow up in 1-2 weeks for recheck and in person neuro evaluation  If you are having worsening headaches when trying to get off of over-the-counter medications it will be helpful for you to come into the clinic so we can do some injections and start some medicines such as steroids and nausea medicine to help while you get off the over-the-counter meds   Analgesic Rebound Headache An analgesic rebound headache, sometimes called a medication overuse headache, is a headache that comes after pain medicine (analgesic) taken to treat the original (primary) headache has worn off. Any type of primary headache can return as a rebound headache if a person regularly takes analgesics more than three times a week to treat it. The types of primary headaches that are commonly associated with rebound headaches include:  Migraines.  Headaches that arise from tense muscles in the head and neck area (tension headaches).  Headaches that develop and happen  again (recur) on one side of the head and around the eye (cluster headaches). If rebound headaches continue, they become chronic daily headaches. What are the causes? This condition may be caused by frequent use of:  Over-the-counter medicines such as aspirin, ibuprofen, and acetaminophen.  Sinus relief medicines and other medicines that contain caffeine.  Narcotic pain medicines such as codeine and oxycodone. What are the signs or symptoms? The symptoms of a rebound headache are the same as the symptoms of the original headache. Some of the symptoms of specific types of headaches include: Migraine headache  Pulsing or throbbing pain on one or both sides of the head.  Severe pain that interferes with daily activities.  Pain that is worsened by physical activity.  Nausea, vomiting, or both.  Pain with exposure to bright light, loud noises, or strong smells.  General sensitivity to bright light, loud noises, or strong smells.  Visual changes.  Numbness of one or both arms. Tension headache  Pressure around the head.  Dull, aching head pain.  Pain felt over the front and sides of the head.  Tenderness in the muscles of the head, neck, and shoulders. Cluster headache  Severe pain that begins in or around one eye or temple.  Redness and tearing in the eye on the same side as the pain.  Droopy or swollen eyelid.  One-sided head pain.  Nausea.  Runny nose.  Sweaty, pale facial skin.  Restlessness. How is this diagnosed? This condition  is diagnosed by:  Reviewing your medical history. This includes the nature of your primary headaches.  Reviewing the types of pain medicines that you have been using to treat your headaches and how often you take them. How is this treated? This condition may be treated or managed by:  Discontinuing frequent use of the analgesic medicine. Doing this may worsen your headaches at first, but the pain should eventually become more  manageable, less frequent, and less severe.  Seeing a headache specialist. He or she may be able to help you manage your headaches and help make sure there is not another cause of the headaches.  Using methods of stress relief, such as acupuncture, counseling, biofeedback, and massage. Talk with your health care provider about which methods might be good for you. Follow these instructions at home:  Take over-the-counter and prescription medicines only as told by your health care provider.  Stop the repeated use of pain medicine as told by your health care provider. Stopping can be difficult. Carefully follow instructions from your health care provider.  Avoid triggers that are known to cause your primary headaches.  Keep all follow-up visits as told by your health care provider. This is important. Contact a health care provider if:  You continue to experience headaches after following treatments that your health care provider recommended. Get help right away if:  You develop new headache pain.  You develop headache pain that is different than what you have experienced in the past.  You develop numbness or tingling in your arms or legs.  You develop changes in your speech or vision. This information is not intended to replace advice given to you by your health care provider. Make sure you discuss any questions you have with your health care provider. Document Revised: 05/17/2017 Document Reviewed: 11/07/2015 Elsevier Patient Education  2020 [employer]    General Headache Without Cause A headache is pain or discomfort that is felt around the head or neck area. There are many causes and types of headaches. In some cases, the cause may not be found. Follow these instructions at home: Watch your condition for any changes. Let your doctor know about them. Take these steps to help with your condition: Managing pain      Take over-the-counter and prescription medicines only as  told by your doctor.  Lie down in a dark, quiet room when you have a headache.  If told, put ice on your head and neck area: ? Put ice in a plastic bag. ? Place a towel between your skin and the bag. ? Leave the ice on for 20 minutes, 2-3 times per day.  If told, put heat on the affected area. Use the heat source that your doctor recommends, such as a moist heat pack or a heating pad. ? Place a towel between your skin and the heat source. ? Leave the heat on for 20-30 minutes. ? Remove the heat if your skin turns bright red. This is very important if you are unable to feel pain, heat, or cold. You may have a greater risk of getting burned.  Keep lights dim if bright lights bother you or make your headaches worse. Eating and drinking  Eat meals on a regular schedule.  If you drink alcohol: ? Limit how much you use to:  0-1 drink a day for women.  0-2 drinks a day for men. ? Be aware of how much alcohol is in your drink. In the [place] one drink equals  one 12 oz bottle of beer (355 mL), one 5 oz glass of wine (148 mL), or one 1 oz glass of hard liquor (44 mL).  Stop drinking caffeine, or reduce how much caffeine you drink. General instructions   Keep a journal to find out if certain things bring on headaches. For example, write down: ? What you eat and drink. ? How much sleep you get. ? Any change to your diet or medicines.  Get a massage or try other ways to relax.  Limit stress.  Sit up straight. Do not tighten (tense) your muscles.  Do not use any products that contain nicotine or tobacco. This includes cigarettes, e-cigarettes, and chewing tobacco. If you need help quitting, ask your doctor.  Exercise regularly as told by your doctor.  Get enough sleep. This often means 7-9 hours of sleep each night.  Keep all follow-up visits as told by your doctor. This is important.   Contact a doctor if:  Your symptoms are not helped by medicine.  You have a headache that  feels different than the other headaches.  You feel sick to your stomach (nauseous) or you throw up (vomit).  You have a fever.   Get help right away if:  Your headache gets very bad quickly.  Your headache gets worse after a lot of physical activity.  You keep throwing up.  You have a stiff neck.  You have trouble seeing.  You have trouble speaking.  You have pain in the eye or ear.  Your muscles are weak or you lose muscle control.  You lose your balance or have trouble walking.  You feel like you will pass out (faint) or you pass out.  You are mixed up (confused).  You have a seizure.   Summary  A headache is pain or discomfort that is felt around the head or neck area.  There are many causes and types of headaches. In some cases, the cause may not be found.  Keep a journal to help find out what causes your headaches. Watch your condition for any changes. Let your doctor know about them.  Contact a doctor if you have a headache that is different from usual, or if your headache is not helped by medicine.  Get help right away if your headache gets very bad, you throw up, you have trouble seeing, you lose your balance, or you have a seizure.     This information is not intended to replace advice given to you by your health care provider. Make sure you discuss any questions you have with your health care provider. Document Revised: 12/23/2017 Document Reviewed: 12/23/2017 Elsevier Patient Education  2020 [employer].

## 2019-10-24 ENCOUNTER — Encounter: Payer: Self-pay | Admitting: Family Medicine

## 2019-11-26 ENCOUNTER — Encounter: Payer: Self-pay | Admitting: Family Medicine

## 2019-12-04 ENCOUNTER — Ambulatory Visit: Payer: BC Managed Care – PPO | Admitting: Family Medicine

## 2020-01-18 ENCOUNTER — Ambulatory Visit (INDEPENDENT_AMBULATORY_CARE_PROVIDER_SITE_OTHER): Payer: BC Managed Care – PPO | Admitting: Obstetrics and Gynecology

## 2020-01-18 ENCOUNTER — Other Ambulatory Visit: Payer: Self-pay

## 2020-01-18 ENCOUNTER — Encounter: Payer: Self-pay | Admitting: Obstetrics and Gynecology

## 2020-01-18 VITALS — BP 104/62 | HR 86 | Ht 67.0 in | Wt 127.0 lb

## 2020-01-18 DIAGNOSIS — N926 Irregular menstruation, unspecified: Secondary | ICD-10-CM | POA: Diagnosis not present

## 2020-01-18 DIAGNOSIS — Z3169 Encounter for other general counseling and advice on procreation: Secondary | ICD-10-CM | POA: Diagnosis not present

## 2020-01-18 NOTE — Progress Notes (Signed)
Obstetrics & Gynecology Office Visit   Chief Complaint: Preconception Consult  History of Present Illness: Patient is a 29 y.o. G0P0000 interested in pursuing pregnancy in the near future.  She reports slightly irregular menses menstrual periods, and is currently using nothing for contraception.  She discontinued Tri-Sprintec at start of the year.  Reports she did skip a menstrual cycle in May, has had menstrual cycle approximately 2 weeks early last month.  She has not prior pregnancies.  Her past medical history is notable for generalized anxiety currently on Buspar 41m.  The patient is current on her vaccinations.  She has had chickenpox ( immune based on serologies 01/15/2017).  Family history for the patient and her partner's family were reviewed.  There is no family history of genetic diseases.   Ovulatory Evaluation LMP: Patient's last menstrual period was 12/30/2019. Interval irregular  Duration of flow: 5 days Heavy Menses: no Clots: no Intermenstrual Bleeding: no  Molimina yes Ovulation Predictor Kits Positive  no  PCOS  Wt Change: Yes, lost about 20lbs in the last year with bootcamp Hirsutism: no Acne: no  Hyperprolactinemia Galactorrhea: no Headaches: no Vision Changes:  no  Thyroid Temperature Intolerance: no Hair Thinning:  no  Review of Systems: Review of Systems  Constitutional: Negative.   Eyes: Negative for blurred vision.  Gastrointestinal: Negative.   Genitourinary: Negative.   Neurological: Negative for headaches.    Past Medical History:  Patient Active Problem List   Diagnosis Date Noted  . Encounter for birth control pills maintenance 01/15/2017  . Fatigue 02/21/2016  . Abnormal thyroid blood test 07/28/2015  . Preventative health care 07/20/2015  . GAD (generalized anxiety disorder) 07/20/2015  . Insomnia 07/20/2015    Past Surgical History:  Past Surgical History:  Procedure Laterality Date  . NM RENAL LASIX (ANeboHX)    .  TONSILLECTOMY AND ADENOIDECTOMY    . WISDOM TOOTH EXTRACTION      Gynecologic History: Patient's last menstrual period was 12/30/2019.  Obstetric History: G0P0000  Family History:  Family History  Problem Relation Age of Onset  . Ovarian cysts Mother   . Diabetes Maternal Grandmother   . Hypertension Maternal Grandmother     Social History:  Social History   Socioeconomic History  . Marital status: Married    Spouse name: Shawn   . Number of children: 0  . Years of education: Not on file  . Highest education level: Bachelor's degree (e.g., BA, AB, BS)  Occupational History  . Not on file  Tobacco Use  . Smoking status: Never Smoker  . Smokeless tobacco: Never Used  Vaping Use  . Vaping Use: Never used  Substance and Sexual Activity  . Alcohol use: No  . Drug use: No  . Sexual activity: Yes    Partners: Male    Birth control/protection: None  Other Topics Concern  . Not on file  Social History Narrative  . Not on file   Social Determinants of Health   Financial Resource Strain:   . Difficulty of Paying Living Expenses:   Food Insecurity:   . Worried About RCharity fundraiserin the Last Year:   . RArboriculturistin the Last Year:   Transportation Needs:   . LFilm/video editor(Medical):   .Marland KitchenLack of Transportation (Non-Medical):   Physical Activity: Sufficiently Active  . Days of Exercise per Week: 4 days  . Minutes of Exercise per Session: 60 min  Stress:   . Feeling  of Stress :   Social Connections:   . Frequency of Communication with Friends and Family:   . Frequency of Social Gatherings with Friends and Family:   . Attends Religious Services:   . Active Member of Clubs or Organizations:   . Attends Archivist Meetings:   Marland Kitchen Marital Status:   Intimate Partner Violence:   . Fear of Current or Ex-Partner:   . Emotionally Abused:   Marland Kitchen Physically Abused:   . Sexually Abused:     Allergies:  No Known Allergies  Medications: Prior to  Admission medications   Medication Sig Start Date End Date Taking? Authorizing Provider  busPIRone (BUSPAR) 5 MG tablet Take 1 tablet (5 mg total) by mouth 2 (two) times daily. For GAD and insomnia sx 07/20/19  Yes Delsa Grana, PA-C  ferrous sulfate 325 (65 FE) MG EC tablet Take 325 mg by mouth daily.   Yes [provider]  folic acid (FOLVITE) 1 MG tablet Take 1 mg by mouth daily.   Yes [provider]  Multiple Vitamin (MULTIVITAMIN) tablet Take 1 tablet by mouth daily.   Yes [provider]  vitamin B-12 (CYANOCOBALAMIN) 100 MCG tablet Take 100 mcg by mouth daily.   Yes [provider]    Physical Exam Vitals:  Vitals:   01/18/20 1102  BP: (!) 104/62  Pulse: 86   Patient's last menstrual period was 12/30/2019.  General: NAD HEENT: normocephalic, anicteric Pulmonary: No increased work of breathing Neurologic: Grossly intact Psychiatric: mood appropriate, affect full  Female chaperone present for pelvic and breast  portions of the physical exam  Immunization History  Administered Date(s) Administered  . DTaP 08/01/1992, 09/23/1992, 12/23/1992, 08/29/1993, 09/01/1996  . Hepatitis B Jun 27, 1990, 08/01/1992, 03/10/1993  . HiB (PRP-OMP) 08/01/1992, 09/23/1992, 12/23/1992, 08/29/1993  . IPV 08/01/1992, 09/23/1992, 08/29/1993, 09/01/1996  . MMR 08/29/1993, 09/01/1996  . Tdap 05/18/2008   d  Assessment: 29 y.o. G0P0000 presenting for preconception counseling  Plan: Problem List Items Addressed This Visit    None    Visit Diagnoses    Encounter for preconception consultation    -  Primary   Irregular menstrual cycle       Relevant Orders   TSH+Prl+FSH+TestT+LH+DHEA S...   US Transvaginal Non-OB   Estradiol      1) The patient was instructed to start prenatal vitamins at least one month prior to actively trying to conceive.  The role and rational of prenatal vitamins in preventing neural tube defects were discussed.  2) Immunizations -  not COVID vaccinated to date, otherwise up to date  3) Family history reviewed.  Preconception genetic testing and or counseling was not offered at today's visit.  4) We discussed the underlying etiologies which may be implicated in a couple experiencing difficulty conceiving.  The average couple will conceive within the span of 1 year with unprotected coitus, with a monthly fecundity rate of 20% or 1 in 5.  Even without further work up or intervention the patient and her partner may be successful in conceiving unassisted, although if an underlying etiology can be identified and addressed fecundity rate may improve.  The work up entails examining for ovulatory function, tubal patency, and ruling out female factor infertility.  These may be looked at concurrently or sequentially.  The downside of sequential work up is that this method may miss issues if more than one compartment is contributing.  She is aware that tubal factor or moderate to severe female factor infertility will require  further consultation with a reproductive endocrinologist.  In the case of anovulation, use of Clomid (clomiphen citrate) or Femara (letrazole) were discussed with the understanding the the later is an off-label, but well supported use.  Current recommendation are to use letrozole first line secondary to increased live birth rate compared to clomid for patients with PCOS ("Polycystic Ovarian Syndrome" ACOG Practice Bulletin 194 June 2018).  With either of these drugs the risk of multiples increases from the standard population rate of 2% to approximately 10%, with higher order multiples possible but unlikely.  Both drugs may require some time to titrate to the appropriate dosage to ensure consistent ovulation.  Cycles will be limited to 6 cycles on each drug secondary to decreasing rates of conception after 6 cycles.  In addition should patient be started on ovulation induction with Clomid she was advised to discontinue the drug for  any vision changes as this is a rare but potentially permanent side-effect if medication is continued.  Was counseled that letrozole may cause idiopathic bone pain that generally resolves.  Hot flashes, headaches, and nausea were mentioned as the most commonly encountered side-effects of both drugs.  We discussed timing of intercourse as well as the use of ovulation predictor kits identify the patient's fertile window each month.     5) A total of 15 minutes were spent in face-to-face contact with the patient during this encounter with over half of that time devoted to counseling and coordination of care.  6) Return in about 1 week (around 01/25/2020) for TVUS and follow up.   Malachy Mood, MD, [facility], [place]

## 2020-01-23 LAB — TSH+PRL+FSH+TESTT+LH+DHEA S...
17-Hydroxyprogesterone: 56 ng/dL
Androstenedione: 163 ng/dL (ref 41–262)
DHEA-SO4: 117 ug/dL (ref 84.8–378.0)
FSH: 10.8 m[IU]/mL
LH: 17.7 m[IU]/mL
Prolactin: 11.3 ng/mL (ref 4.8–23.3)
TSH: 3.21 u[IU]/mL (ref 0.450–4.500)
Testosterone, Free: 1.5 pg/mL (ref 0.0–4.2)
Testosterone: 18 ng/dL (ref 13–71)

## 2020-01-23 LAB — ESTRADIOL: Estradiol: 67 pg/mL

## 2020-02-08 ENCOUNTER — Ambulatory Visit: Payer: BC Managed Care – PPO | Admitting: Obstetrics and Gynecology

## 2020-02-08 ENCOUNTER — Ambulatory Visit: Payer: Self-pay

## 2020-02-08 DIAGNOSIS — N926 Irregular menstruation, unspecified: Secondary | ICD-10-CM

## 2020-02-10 ENCOUNTER — Other Ambulatory Visit: Payer: Self-pay | Admitting: Obstetrics and Gynecology

## 2020-02-10 DIAGNOSIS — N970 Female infertility associated with anovulation: Secondary | ICD-10-CM

## 2020-02-10 MED ORDER — LETROZOLE 2.5 MG PO TABS: 2.5000 mg | ORAL_TABLET | Freq: Every day | ORAL | 0 refills | Status: DC

## 2020-02-10 NOTE — Telephone Encounter (Signed)
Labs 02/29/2020

## 2020-02-10 NOTE — Progress Notes (Signed)
LMP 02/08/2020 Cycle I letrozole 2.5mg  day 21 progesterone 02/29/2020

## 2020-02-10 NOTE — Telephone Encounter (Signed)
Voicemail box is full - unable to leave message

## 2020-02-12 ENCOUNTER — Other Ambulatory Visit: Payer: Self-pay | Admitting: Obstetrics and Gynecology

## 2020-02-15 NOTE — Telephone Encounter (Signed)
Advise

## 2020-02-19 ENCOUNTER — Ambulatory Visit (INDEPENDENT_AMBULATORY_CARE_PROVIDER_SITE_OTHER): Payer: BC Managed Care – PPO

## 2020-02-19 ENCOUNTER — Other Ambulatory Visit: Payer: Self-pay

## 2020-02-19 ENCOUNTER — Encounter: Payer: Self-pay | Admitting: Obstetrics and Gynecology

## 2020-02-19 ENCOUNTER — Ambulatory Visit (INDEPENDENT_AMBULATORY_CARE_PROVIDER_SITE_OTHER): Payer: BC Managed Care – PPO | Admitting: Obstetrics and Gynecology

## 2020-02-19 VITALS — BP 108/72 | HR 82 | Ht 67.0 in | Wt 122.0 lb

## 2020-02-19 DIAGNOSIS — N926 Irregular menstruation, unspecified: Secondary | ICD-10-CM

## 2020-02-19 DIAGNOSIS — N939 Abnormal uterine and vaginal bleeding, unspecified: Secondary | ICD-10-CM

## 2020-02-19 NOTE — Progress Notes (Signed)
Gynecology Ultrasound Follow Up  Chief Complaint:  Chief Complaint  Patient presents with  . Follow-up    U/S     History of Present Illness: Patient is a 29 y.o. female who presents today for ultrasound evaluation of AUB-0.  Ultrasound demonstrates the following findgins Adnexa: no masses seen, normal follicular development noted Uterus: non-enlarged and homogenous, distinct myometrial and endometrial interface with endometrial stripe non-enlarged Additional: no free fluid  Review of Systems: Review of Systems  Constitutional: Negative.   Gastrointestinal: Negative.   Genitourinary: Negative.     Past Medical History:  Past Medical History:  Diagnosis Date  . Abnormal thyroid blood test 07/28/2015  . GAD (generalized anxiety disorder) 07/20/2015  . Insomnia 07/20/2015    Past Surgical History:  Past Surgical History:  Procedure Laterality Date  . NM RENAL LASIX ([facility] HX)    . TONSILLECTOMY AND ADENOIDECTOMY    . WISDOM TOOTH EXTRACTION      Gynecologic History:  Patient's last menstrual period was 02/12/2020. Contraception: none Last Pap: 04/21/2019 Results were: .no abnormalities  Family History:  Family History  Problem Relation Age of Onset  . Ovarian cysts Mother   . Diabetes Maternal Grandmother   . Hypertension Maternal Grandmother     Social History:  Social History   Socioeconomic History  . Marital status: Married    Spouse name: Shawn   . Number of children: 0  . Years of education: Not on file  . Highest education level: Bachelor's degree (e.g., BA, AB, BS)  Occupational History  . Not on file  Tobacco Use  . Smoking status: Never Smoker  . Smokeless tobacco: Never Used  Vaping Use  . Vaping Use: Never used  Substance and Sexual Activity  . Alcohol use: No  . Drug use: No  . Sexual activity: Yes    Partners: Male    Birth control/protection: None  Other Topics Concern  . Not on file  Social History Narrative  . Not on file    Social Determinants of Health   Financial Resource Strain:   . Difficulty of Paying Living Expenses: Not on file  Food Insecurity:   . Worried About Programme researcher, broadcasting/film/video in the Last Year: Not on file  . Ran Out of Food in the Last Year: Not on file  Transportation Needs:   . Lack of Transportation (Medical): Not on file  . Lack of Transportation (Non-Medical): Not on file  Physical Activity: Sufficiently Active  . Days of Exercise per Week: 4 days  . Minutes of Exercise per Session: 60 min  Stress:   . Feeling of Stress : Not on file  Social Connections:   . Frequency of Communication with Friends and Family: Not on file  . Frequency of Social Gatherings with Friends and Family: Not on file  . Attends Religious Services: Not on file  . Active Member of Clubs or Organizations: Not on file  . Attends Banker Meetings: Not on file  . Marital Status: Not on file  Intimate Partner Violence:   . Fear of Current or Ex-Partner: Not on file  . Emotionally Abused: Not on file  . Physically Abused: Not on file  . Sexually Abused: Not on file    Allergies:  No Known Allergies  Medications: Prior to Admission medications   Medication Sig Start Date End Date Taking? Authorizing Provider  busPIRone (BUSPAR) 5 MG tablet Take 1 tablet (5 mg total) by mouth 2 (two) times daily.  For GAD and insomnia sx 07/20/19  Yes Danelle Berry, PA-C  ferrous sulfate 325 (65 FE) MG EC tablet Take 325 mg by mouth daily.   Yes [provider]  folic acid (FOLVITE) 1 MG tablet Take 1 mg by mouth daily.   Yes [provider]  letrozole [facility]) 2.5 MG tablet Take 1 tablet by mouth once daily for 5 days 02/15/20  Yes Vena Austria, MD  Multiple Vitamin (MULTIVITAMIN) tablet Take 1 tablet by mouth daily.   Yes [provider]  vitamin B-12 (CYANOCOBALAMIN) 100 MCG tablet Take 100 mcg by mouth daily.   Yes [provider]    Physical Exam Vitals: Blood pressure  108/72, pulse 82, height 5\' 7"  (1.702 m), weight 122 lb (55.3 kg), last menstrual period 02/12/2020.  General: NAD HEENT: normocephalic, anicteric Pulmonary: No increased work of breathing Extremities: no edema, erythema, or tenderness Neurologic: Grossly intact, normal gait Psychiatric: mood appropriate, affect full   Assessment: 29 y.o. G0P0000 follow up AUB-0  Plan: Problem List Items Addressed This Visit    None    Visit Diagnoses    Abnormal uterine bleeding    -  Primary      1) AUB-O - normal TVUS, laboratory work up normal.  Continue with current treatment plan with letrozole.  GIven email to faciliated timely cycle starts,  We discussed the underlying etiologies which may be implicated in a couple experiencing difficulty conceiving.  The average couple will conceive within the span of 1 year with unprotected coitus, with a monthly fecundity rate of 20% or 1 in 5.  Even without further work up or intervention the patient and her partner may be successful in conceiving unassisted, although if an underlying etiology can be identified and addressed fecundity rate may improve.  The work up entails examining for ovulatory function, tubal patency, and ruling out female factor infertility.  These may be looked at concurrently or sequentially.  The downside of sequential work up is that this method may miss issues if more than one compartment is contributing.  She is aware that tubal factor or moderate to severe female factor infertility will require further consultation with a reproductive endocrinologist.  In the case of anovulation, use of Clomid (clomiphen citrate) or Femara (letrazole) were discussed with the understanding the the later is an off-label, but well supported use.  Current recommendation are to use letrozole first line secondary to increased live birth rate compared to clomid for patients with PCOS ("Polycystic Ovarian Syndrome" ACOG Practice Bulletin 194 June 2018).  With either  of these drugs the risk of multiples increases from the standard population rate of 2% to approximately 10%, with higher order multiples possible but unlikely.  Both drugs may require some time to titrate to the appropriate dosage to ensure consistent ovulation.  Cycles will be limited to 6 cycles on each drug secondary to decreasing rates of conception after 6 cycles.  In addition should patient be started on ovulation induction with Clomid she was advised to discontinue the drug for any vision changes as this is a rare but potentially permanent side-effect if medication is continued.  Was counseled that letrozole may cause idiopathic bone pain that generally resolves.  Hot flashes, headaches, and nausea were mentioned as the most commonly encountered side-effects of both drugs.  We discussed timing of intercourse as well as the use of ovulation predictor kits identify the patient's fertile window each month.      2) A total of 15 minutes  were spent in face-to-face contact with the patient during this encounter with over half of that time devoted to counseling and coordination of care.  3) No follow-ups on file.    Vena Austria, MD, [facility], [facility]

## 2020-02-23 ENCOUNTER — Other Ambulatory Visit: Payer: Self-pay | Admitting: Family Medicine

## 2020-02-23 DIAGNOSIS — F411 Generalized anxiety disorder: Secondary | ICD-10-CM

## 2020-02-29 ENCOUNTER — Other Ambulatory Visit: Payer: BC Managed Care – PPO

## 2020-02-29 ENCOUNTER — Other Ambulatory Visit: Payer: Self-pay

## 2020-02-29 DIAGNOSIS — N970 Female infertility associated with anovulation: Secondary | ICD-10-CM

## 2020-03-01 LAB — PROGESTERONE: Progesterone: 12.4 ng/mL

## 2020-03-10 ENCOUNTER — Other Ambulatory Visit: Payer: Self-pay | Admitting: Obstetrics and Gynecology

## 2020-03-10 MED ORDER — LETROZOLE 2.5 MG PO TABS
ORAL_TABLET | ORAL | 0 refills | Status: DC
Start: 2020-03-10 — End: 2020-04-22

## 2020-03-10 NOTE — Progress Notes (Signed)
LMP 03/09/2020 cycle II letrozole 2.5mg 

## 2020-04-12 ENCOUNTER — Other Ambulatory Visit: Payer: BC Managed Care – PPO

## 2020-04-12 ENCOUNTER — Other Ambulatory Visit: Payer: Self-pay

## 2020-04-12 ENCOUNTER — Other Ambulatory Visit: Payer: Self-pay | Admitting: Obstetrics and Gynecology

## 2020-04-12 DIAGNOSIS — O3680X0 Pregnancy with inconclusive fetal viability, not applicable or unspecified: Secondary | ICD-10-CM

## 2020-04-12 NOTE — Progress Notes (Signed)
HCG and progesterone today, repeat in 48-hrs

## 2020-04-12 NOTE — Telephone Encounter (Signed)
Needs lab draw today 04/12/2020 and Thursday 04/14/2020

## 2020-04-13 ENCOUNTER — Other Ambulatory Visit: Payer: Self-pay | Admitting: Obstetrics and Gynecology

## 2020-04-13 DIAGNOSIS — O3680X0 Pregnancy with inconclusive fetal viability, not applicable or unspecified: Secondary | ICD-10-CM

## 2020-04-13 DIAGNOSIS — Z3689 Encounter for other specified antenatal screening: Secondary | ICD-10-CM

## 2020-04-13 LAB — PROGESTERONE: Progesterone: 22.3 ng/mL

## 2020-04-13 LAB — BETA HCG QUANT (REF LAB): hCG Quant: 1496 m[IU]/mL

## 2020-04-14 ENCOUNTER — Other Ambulatory Visit: Payer: Self-pay

## 2020-04-14 ENCOUNTER — Other Ambulatory Visit: Payer: BC Managed Care – PPO

## 2020-04-14 DIAGNOSIS — O3680X0 Pregnancy with inconclusive fetal viability, not applicable or unspecified: Secondary | ICD-10-CM

## 2020-04-15 ENCOUNTER — Other Ambulatory Visit: Payer: Self-pay | Admitting: Obstetrics and Gynecology

## 2020-04-15 DIAGNOSIS — Z3689 Encounter for other specified antenatal screening: Secondary | ICD-10-CM

## 2020-04-15 DIAGNOSIS — O3680X0 Pregnancy with inconclusive fetal viability, not applicable or unspecified: Secondary | ICD-10-CM

## 2020-04-15 LAB — BETA HCG QUANT (REF LAB): hCG Quant: 3344 m[IU]/mL

## 2020-04-15 NOTE — Telephone Encounter (Signed)
NOB and dating scan sometime in the next week, ultrasound orders are in

## 2020-04-22 ENCOUNTER — Ambulatory Visit (INDEPENDENT_AMBULATORY_CARE_PROVIDER_SITE_OTHER): Payer: BC Managed Care – PPO

## 2020-04-22 ENCOUNTER — Other Ambulatory Visit: Payer: Self-pay

## 2020-04-22 ENCOUNTER — Encounter: Payer: Self-pay | Admitting: Obstetrics & Gynecology

## 2020-04-22 ENCOUNTER — Ambulatory Visit (INDEPENDENT_AMBULATORY_CARE_PROVIDER_SITE_OTHER): Payer: BC Managed Care – PPO | Admitting: Obstetrics & Gynecology

## 2020-04-22 ENCOUNTER — Other Ambulatory Visit: Payer: Self-pay | Admitting: Obstetrics and Gynecology

## 2020-04-22 ENCOUNTER — Encounter: Payer: BC Managed Care – PPO | Admitting: Family Medicine

## 2020-04-22 ENCOUNTER — Other Ambulatory Visit (HOSPITAL_COMMUNITY)
Admission: RE | Admit: 2020-04-22 | Discharge: 2020-04-22 | Disposition: A | Discharge status: Home / Self Care | Payer: BC Managed Care – PPO | Source: Ambulatory Visit | Attending: Obstetrics & Gynecology | Admitting: Obstetrics & Gynecology

## 2020-04-22 VITALS — BP 100/60 | Wt 126.0 lb

## 2020-04-22 DIAGNOSIS — Z3491 Encounter for supervision of normal pregnancy, unspecified, first trimester: Secondary | ICD-10-CM | POA: Insufficient documentation

## 2020-04-22 DIAGNOSIS — Z124 Encounter for screening for malignant neoplasm of cervix: Secondary | ICD-10-CM | POA: Insufficient documentation

## 2020-04-22 DIAGNOSIS — Z369 Encounter for antenatal screening, unspecified: Secondary | ICD-10-CM

## 2020-04-22 DIAGNOSIS — Z3A10 10 weeks gestation of pregnancy: Secondary | ICD-10-CM

## 2020-04-22 DIAGNOSIS — Z3689 Encounter for other specified antenatal screening: Secondary | ICD-10-CM

## 2020-04-22 DIAGNOSIS — O3680X0 Pregnancy with inconclusive fetal viability, not applicable or unspecified: Secondary | ICD-10-CM

## 2020-04-22 DIAGNOSIS — O0990 Supervision of high risk pregnancy, unspecified, unspecified trimester: Secondary | ICD-10-CM | POA: Insufficient documentation

## 2020-04-22 LAB — OB RESULTS CONSOLE VARICELLA ZOSTER ANTIBODY, IGG: Varicella: IMMUNE

## 2020-04-22 LAB — OB RESULTS CONSOLE GC/CHLAMYDIA: Gonorrhea: NEGATIVE

## 2020-04-22 NOTE — Patient Instructions (Signed)
First Trimester of Pregnancy The first trimester of pregnancy is from week 1 until the end of week 13 (months 1 through 3). A week after a sperm fertilizes an egg, the egg will implant on the wall of the uterus. This embryo will begin to develop into a baby. Genes from you and your partner will form the baby. The female genes will determine whether the baby will be a boy or a girl. At 6-8 weeks, the eyes and face will be formed, and the heartbeat can be seen on ultrasound. At the end of 12 weeks, all the baby's organs will be formed. Now that you are pregnant, you will want to do everything you can to have a healthy baby. Two of the most important things are to get good prenatal care and to follow your health care provider's instructions. Prenatal care is all the medical care you receive before the baby's birth. This care will help prevent, find, and treat any problems during the pregnancy and childbirth. Body changes during your first trimester Your body goes through many changes during pregnancy. The changes vary from woman to woman.  You may gain or lose a couple of pounds at first.  You may feel sick to your stomach (nauseous) and you may throw up (vomit). If the vomiting is uncontrollable, call your health care provider.  You may tire easily.  You may develop headaches that can be relieved by medicines. All medicines should be approved by your health care provider.  You may urinate more often. Painful urination may mean you have a bladder infection.  You may develop heartburn as a result of your pregnancy.  You may develop constipation because certain hormones are causing the muscles that push stool through your intestines to slow down.  You may develop hemorrhoids or swollen veins (varicose veins).  Your breasts may begin to grow larger and become tender. Your nipples may stick out more, and the tissue that surrounds them (areola) may become darker.  Your gums may bleed and may be  sensitive to brushing and flossing.  Dark spots or blotches (chloasma, mask of pregnancy) may develop on your face. This will likely fade after the baby is born.  Your menstrual periods will stop.  You may have a loss of appetite.  You may develop cravings for certain kinds of food.  You may have changes in your emotions from day to day, such as being excited to be pregnant or being concerned that something may go wrong with the pregnancy and baby.  You may have more vivid and strange dreams.  You may have changes in your hair. These can include thickening of your hair, rapid growth, and changes in texture. Some women also have hair loss during or after pregnancy, or hair that feels dry or thin. Your hair will most likely return to normal after your baby is born. What to expect at prenatal visits During a routine prenatal visit:  You will be weighed to make sure you and the baby are growing normally.  Your blood pressure will be taken.  Your abdomen will be measured to track your baby's growth.  The fetal heartbeat will be listened to between weeks 10 and 14 of your pregnancy.  Test results from any previous visits will be discussed. Your health care provider may ask you:  How you are feeling.  If you are feeling the baby move.  If you have had any abnormal symptoms, such as leaking fluid, bleeding, severe headaches, or abdominal   cramping.  If you are using any tobacco products, including cigarettes, chewing tobacco, and electronic cigarettes.  If you have any questions. Other tests that may be performed during your first trimester include:  Blood tests to find your blood type and to check for the presence of any previous infections. The tests will also be used to check for low iron levels (anemia) and protein on red blood cells (Rh antibodies). Depending on your risk factors, or if you previously had diabetes during pregnancy, you may have tests to check for high blood sugar  that affects pregnant women (gestational diabetes).  Urine tests to check for infections, diabetes, or protein in the urine.  An ultrasound to confirm the proper growth and development of the baby.  Fetal screens for spinal cord problems (spina bifida) and Down syndrome.  HIV (human immunodeficiency virus) testing. Routine prenatal testing includes screening for HIV, unless you choose not to have this test.  You may need other tests to make sure you and the baby are doing well. Follow these instructions at home: Medicines  Follow your health care provider's instructions regarding medicine use. Specific medicines may be either safe or unsafe to take during pregnancy.  Take a prenatal vitamin that contains at least 600 micrograms (mcg) of folic acid.  If you develop constipation, try taking a stool softener if your health care provider approves. Eating and drinking   Eat a balanced diet that includes fresh fruits and vegetables, whole grains, good sources of protein such as meat, eggs, or tofu, and low-fat dairy. Your health care provider will help you determine the amount of weight gain that is right for you.  Avoid raw meat and uncooked cheese. These carry germs that can cause birth defects in the baby.  Eating four or five small meals rather than three large meals a day may help relieve nausea and vomiting. If you start to feel nauseous, eating a few soda crackers can be helpful. Drinking liquids between meals, instead of during meals, also seems to help ease nausea and vomiting.  Limit foods that are high in fat and processed sugars, such as fried and sweet foods.  To prevent constipation: ? Eat foods that are high in fiber, such as fresh fruits and vegetables, whole grains, and beans. ? Drink enough fluid to keep your urine clear or pale yellow. Activity  Exercise only as directed by your health care provider. Most women can continue their usual exercise routine during  pregnancy. Try to exercise for 30 minutes at least 5 days a week. Exercising will help you: ? Control your weight. ? Stay in shape. ? Be prepared for labor and delivery.  Experiencing pain or cramping in the lower abdomen or lower back is a good sign that you should stop exercising. Check with your health care provider before continuing with normal exercises.  Try to avoid standing for long periods of time. Move your legs often if you must stand in one place for a long time.  Avoid heavy lifting.  Wear low-heeled shoes and practice good posture.  You may continue to have sex unless your health care provider tells you not to. Relieving pain and discomfort  Wear a good support bra to relieve breast tenderness.  Take warm sitz baths to soothe any pain or discomfort caused by hemorrhoids. Use hemorrhoid cream if your health care provider approves.  Rest with your legs elevated if you have leg cramps or low back pain.  If you develop varicose veins in   your legs, wear support hose. Elevate your feet for 15 minutes, 3-4 times a day. Limit salt in your diet. Prenatal care  Schedule your prenatal visits by the twelfth week of pregnancy. They are usually scheduled monthly at first, then more often in the last 2 months before delivery.  Write down your questions. Take them to your prenatal visits.  Keep all your prenatal visits as told by your health care provider. This is important. Safety  Wear your seat belt at all times when driving.  Make a list of emergency phone numbers, including numbers for family, friends, the hospital, and police and fire departments. General instructions  Ask your health care provider for a referral to a local prenatal education class. Begin classes no later than the beginning of month 6 of your pregnancy.  Ask for help if you have counseling or nutritional needs during pregnancy. Your health care provider can offer advice or refer you to specialists for help  with various needs.  Do not use hot tubs, steam rooms, or saunas.  Do not douche or use tampons or scented sanitary pads.  Do not cross your legs for long periods of time.  Avoid cat litter boxes and soil used by cats. These carry germs that can cause birth defects in the baby and possibly loss of the fetus by miscarriage or stillbirth.  Avoid all smoking, herbs, alcohol, and medicines not prescribed by your health care provider. Chemicals in these products affect the formation and growth of the baby.  Do not use any products that contain nicotine or tobacco, such as cigarettes and e-cigarettes. If you need help quitting, ask your health care provider. You may receive counseling support and other resources to help you quit.  Schedule a dentist appointment. At home, brush your teeth with a soft toothbrush and be gentle when you floss. Contact a health care provider if:  You have dizziness.  You have mild pelvic cramps, pelvic pressure, or nagging pain in the abdominal area.  You have persistent nausea, vomiting, or diarrhea.  You have a bad smelling vaginal discharge.  You have pain when you urinate.  You notice increased swelling in your face, hands, legs, or ankles.  You are exposed to fifth disease or chickenpox.  You are exposed to German measles (rubella) and have never had it. Get help right away if:  You have a fever.  You are leaking fluid from your vagina.  You have spotting or bleeding from your vagina.  You have severe abdominal cramping or pain.  You have rapid weight gain or loss.  You vomit blood or material that looks like coffee grounds.  You develop a severe headache.  You have shortness of breath.  You have any kind of trauma, such as from a fall or a car accident. Summary  The first trimester of pregnancy is from week 1 until the end of week 13 (months 1 through 3).  Your body goes through many changes during pregnancy. The changes vary from  woman to woman.  You will have routine prenatal visits. During those visits, your health care provider will examine you, discuss any test results you may have, and talk with you about how you are feeling. This information is not intended to replace advice given to you by your health care provider. Make sure you discuss any questions you have with your health care provider. Document Revised: 05/17/2017 Document Reviewed: 05/16/2016 Elsevier Patient Education  2020 [employer]   Genetic Testing During Pregnancy   Genetic testing during pregnancy is also called prenatal genetic testing. This type of testing can determine if your baby is at risk of being born with a disorder caused by abnormal genes or chromosomes (genetic disorder). Chromosomes contain genes that control how your baby will develop in your womb. There are many different genetic disorders. Examples of genetic disorders that may be found through genetic testing include Down syndrome and cystic fibrosis. Gene changes (mutations) can be passed down through families. Genetic testing is offered to all women before or during pregnancy. You can choose whether to have genetic testing. Why is genetic testing done? Genetic testing is done during pregnancy to find out whether your child is at risk for a genetic disorder. Having genetic testing allows you to:  Discuss your test results and options with a genetic counselor.  Prepare for a baby that may be born with a genetic disorder. Learning about the disorder ahead of time helps you be better prepared to manage it. Your health care providers can also be prepared in case your baby requires special care before or after birth.  Consider whether you want to continue with the pregnancy. In some cases, genetic testing may be done to learn about the traits a child will inherit. Types of genetic tests There are two basic types of genetic testing. Screening tests indicate whether your developing baby  (fetus) is at higher risk for a genetic disorder. Diagnostic tests check actual fetal cells to diagnose a genetic disorder. Screening tests     Screening tests will not harm your baby. They are recommended for all pregnant women. Types of screening tests include:  Carrier screening. This test involves checking genes from both parents by testing their blood or saliva. The test checks to find out if the parents carry a genetic mutation that may be passed to a baby. In most cases, both parents must carry the mutation for a baby to be at risk.  First trimester screening. This test combines a blood test with sound wave imaging of your baby (fetal ultrasound). This screening test checks for a risk of Down syndrome or other defects caused by having extra chromosomes. It also checks for defects of the heart, abdomen, or skeleton.  Second trimester screening also combines a blood test with a fetal ultrasound exam. It checks for a risk of genetic defects of the face, brain, spine, heart, or limbs.  Combined or sequential screening. This type of testing combines the results of first and second trimester screening. This type of testing may be more accurate than first or second trimester screening alone.  Cell-free DNA testing. This is a blood test that detects cells released by the placenta that get into the mother's blood. It can be used to check for a risk of Down syndrome, other extra chromosome syndromes, and disorders caused by abnormal numbers of sex chromosomes. This test can be done any time after 10 weeks of pregnancy.  Diagnostic tests Diagnostic tests carry slight risks of problems, including bleeding, infection, and loss of the pregnancy. These tests are done only if your baby is at risk for a genetic disorder. You may meet with a genetic counselor to discuss the risks and benefits before having diagnostic tests. Examples of diagnostic tests include:  Chorionic villus sampling (CVS). This  involves a procedure to remove and test a sample of cells taken from the placenta. The procedure may be done between 10 and 12 weeks of pregnancy.  Amniocentesis. This involves a procedure to remove   and test a sample of fluid (amniotic fluid) and cells from the sac that surrounds the developing baby. The procedure may be done between 15 and 20 weeks of pregnancy. What do the results mean? For a screening test:  If the results are negative, it often means that your child is not at higher risk. There is still a slight chance your child could have a genetic disorder.  If the results are positive, it does not mean your child will have a genetic disorder. It may mean that your child has a higher-than-normal risk for a genetic disorder. In that case, you may want to talk with a genetic counselor about whether you should have diagnostic genetic tests. For a diagnostic test:  If the result is negative, it is unlikely that your child will have a genetic disorder.  If the test is positive for a genetic disorder, it is likely that your child will have the disorder. The test may not tell how severe the disorder will be. Talk with your health care provider about your options. Questions to ask your health care provider Before talking to your health care provider about genetic testing, find out if there is a history of genetic disorders in your family. It may also help to know your family's ethnic origins. Then ask your health care provider the following questions:  Is my baby at risk for a genetic disorder?  What are the benefits of having genetic screening?  What tests are best for me and my baby?  What are the risks of each test?  If I get a positive result on a screening test, what is the next step?  Should I meet with a genetic counselor before having a diagnostic test?  Should my partner or other members of my family be tested?  How much do the tests cost? Will my insurance cover the  testing? Summary  Genetic testing is done during pregnancy to find out whether your child is at risk for a genetic disorder.  Genetic testing is offered to all women before or during pregnancy. You can choose whether to have genetic testing.  There are two basic types of genetic testing. Screening tests indicate whether your developing baby (fetus) is at higher risk for a genetic disorder. Diagnostic tests check actual fetal cells to diagnose a genetic disorder.  If a diagnostic genetic test is positive, talk with your health care provider about your options. This information is not intended to replace advice given to you by your health care provider. Make sure you discuss any questions you have with your health care provider. Document Revised: 09/25/2018 Document Reviewed: 08/19/2017 Elsevier Patient Education  2020 [employer] Inc.  

## 2020-04-22 NOTE — Progress Notes (Signed)
04/22/2020   Chief Complaint: Missed period  Transfer of Care Patient: no  History of Present Illness: Ms. Urquilla is a 29 y.o. G1P0000 [redacted]w[redacted]d based on Patient's last menstrual period was 03/14/2020. with an Estimated Date of Delivery: 12/15/20, with the above CC.   Her periods were: regular periods every 28 days She was using Letrozol, cycle #2, when she conceived.  She has Positive signs or symptoms of nausea/vomiting of pregnancy. She has Negative signs or symptoms of miscarriage or preterm labor She identifies Negative Zika risk factors for her and her partner On any different medications around the time she conceived/early pregnancy: Yes , Buspar for anxiety and sleep History of varicella: Yes   ROS: A 12-point review of systems was performed and negative, except as stated in the above HPI.  OBGYN History: As per HPI. OB History  Gravida Para Term Preterm AB Living  1 0 0 0 0 0  SAB TAB Ectopic Multiple Live Births  0 0 0 0 0    # Outcome Date GA Lbr Len/2nd Weight Sex Delivery Anes PTL Lv  1 Current             Any issues with any prior pregnancies: not applicable Any prior children are healthy, doing well, without any problems or issues: not applicable History of pap smears: Yes. Last pap smear 04/2019. Abnormal: no  History of STIs: No   Past Medical History: Past Medical History:  Diagnosis Date  . Abnormal thyroid blood test 07/28/2015  . GAD (generalized anxiety disorder) 07/20/2015  . Insomnia 07/20/2015    Past Surgical History: Past Surgical History:  Procedure Laterality Date  . NM RENAL LASIX ([facility] HX)    . TONSILLECTOMY AND ADENOIDECTOMY    . WISDOM TOOTH EXTRACTION      Family History:  Family History  Problem Relation Age of Onset  . Ovarian cysts Mother   . Diabetes Maternal Grandmother   . Hypertension Maternal Grandmother    She denies any female cancers, bleeding or blood clotting disorders.  She denies any history of mental retardation, birth  defects or genetic disorders in her or the FOB's history Pt is a second grade teacher at [employer]  Social History:  Social History   Socioeconomic History  . Marital status: Married    Spouse name: Shawn   . Number of children: 0  . Years of education: Not on file  . Highest education level: Bachelor's degree (e.g., BA, AB, BS)  Occupational History  . Not on file  Tobacco Use  . Smoking status: Never Smoker  . Smokeless tobacco: Never Used  Vaping Use  . Vaping Use: Never used  Substance and Sexual Activity  . Alcohol use: No  . Drug use: No  . Sexual activity: Yes    Partners: Male    Birth control/protection: None  Other Topics Concern  . Not on file  Social History Narrative  . Not on file   Social Determinants of Health   Financial Resource Strain:   . Difficulty of Paying Living Expenses: Not on file  Food Insecurity:   . Worried About Programme researcher, broadcasting/film/video in the Last Year: Not on file  . Ran Out of Food in the Last Year: Not on file  Transportation Needs:   . Lack of Transportation (Medical): Not on file  . Lack of Transportation (Non-Medical): Not on file  Physical Activity:   . Days of Exercise per Week: Not on file  . Minutes of  Exercise per Session: Not on file  Stress:   . Feeling of Stress : Not on file  Social Connections:   . Frequency of Communication with Friends and Family: Not on file  . Frequency of Social Gatherings with Friends and Family: Not on file  . Attends Religious Services: Not on file  . Active Member of Clubs or Organizations: Not on file  . Attends Banker Meetings: Not on file  . Marital Status: Not on file  Intimate Partner Violence:   . Fear of Current or Ex-Partner: Not on file  . Emotionally Abused: Not on file  . Physically Abused: Not on file  . Sexually Abused: Not on file   Any pets in the household: no  Allergy: No Known Allergies  Current Outpatient Medications:  Current Outpatient Medications:   Marland Kitchen  Multiple Vitamin (MULTIVITAMIN) tablet, Take 1 tablet by mouth daily., Disp: , Rfl:  .  Prenatal Vit-Fe Fumarate-FA (PRENATAL MULTIVITAMIN) TABS tablet, Take 1 tablet by mouth daily at 12 noon., Disp: , Rfl:  .  vitamin B-12 (CYANOCOBALAMIN) 100 MCG tablet, Take 100 mcg by mouth daily., Disp: , Rfl:  .  busPIRone (BUSPAR) 5 MG tablet, TAKE 1 TABLET BY MOUTH TWICE DAILY FOR  GENERAL ANXIETY DISORDER AND  INSOMNIA  SYMPTOMS (Patient not taking: Reported on 04/22/2020), Disp: 120 tablet, Rfl: 0 .  ferrous sulfate 325 (65 FE) MG EC tablet, Take 325 mg by mouth daily. (Patient not taking: Reported on 04/22/2020), Disp: , Rfl:  .  folic acid (FOLVITE) 1 MG tablet, Take 1 mg by mouth daily. (Patient not taking: Reported on 04/22/2020), Disp: , Rfl:  .  letrozole (FEMARA) 2.5 MG tablet, Take 1 tablet by mouth once daily for 5 days (Patient not taking: Reported on 04/22/2020), Disp: 5 tablet, Rfl: 0   Physical Exam:   BP 100/60   Wt 126 lb (57.2 kg)   LMP 03/14/2020   BMI 19.73 kg/m  Body mass index is 19.73 kg/m. Constitutional: Well nourished, well developed female in no acute distress.  Neck:  Supple, normal appearance, and no thyromegaly  Cardiovascular: S1, S2 normal, no murmur, rub or gallop, regular rate and rhythm Respiratory:  Clear to auscultation bilateral. Normal respiratory effort Abdomen: positive bowel sounds and no masses, hernias; diffusely non tender to palpation, non distended Breasts: breasts appear normal, no suspicious masses, no skin or nipple changes or axillary nodes. Neuro/Psych:  Normal mood and affect.  Skin:  Warm and dry.  Lymphatic:  No inguinal lymphadenopathy.   Pelvic exam: is not limited by body habitus EGBUS: within normal limits, Vagina: within normal limits and with no blood in the vault, Cervix: normal appearing cervix without discharge or lesions, closed/long/high, Uterus:  enlarged: 10 weeks, and Adnexa:  normal adnexa  Assessment: Ms. Bacchi is a 29  y.o. G1P0000 [redacted]w[redacted]d based on Patient's last menstrual period was 03/14/2020. with an Estimated Date of Delivery: 12/15/20,  for prenatal care.  Plan:  1) Avoid alcoholic beverages. 2) Patient encouraged not to smoke.  3) Discontinue the use of all non-medicinal drugs and chemicals.  4) Take prenatal vitamins daily.  5) Seatbelt use advised 6) Nutrition, food safety (fish, cheese advisories, and high nitrite foods) and exercise discussed. 7) Hospital and practice style delivering at [facility] discussed  8) Patient is asked about travel to areas at risk for the Zika virus, and counseled to avoid travel and exposure to mosquitoes or sexual partners who may have themselves been exposed to the virus.  Testing is discussed, and will be ordered as appropriate.  9) Childbirth classes at [facility] advised 10) Genetic Screening, such as with 1st Trimester Screening, cell free fetal DNA, AFP testing, and Ultrasound, as well as with amniocentesis and CVS as appropriate, is discussed with patient. She plans to consider genetic testing this pregnancy. 11) Review of ULTRASOUND.    I have personally reviewed images and report of recent ultrasound done at [facility].    Plan of management to be discussed with patient.    EDC is 12/15/2020  Problem list reviewed and updated.  Annamarie Major, MD, [facility], [facility] 04/22/2020  1:57 PM

## 2020-04-23 LAB — RPR+RH+ABO+RUB AB+AB SCR+CB...
Antibody Screen: NEGATIVE
HIV Screen 4th Generation wRfx: NONREACTIVE
Hematocrit: 37.6 % (ref 34.0–46.6)
Hemoglobin: 12.6 g/dL (ref 11.1–15.9)
Hepatitis B Surface Ag: NEGATIVE
MCH: 34.1 pg — ABNORMAL HIGH (ref 26.6–33.0)
MCHC: 33.5 g/dL (ref 31.5–35.7)
MCV: 102 fL — ABNORMAL HIGH (ref 79–97)
Platelets: 194 10*3/uL (ref 150–450)
RBC: 3.69 x10E6/uL — ABNORMAL LOW (ref 3.77–5.28)
RDW: 10.5 % — ABNORMAL LOW (ref 11.7–15.4)
RPR Ser Ql: NONREACTIVE
Rh Factor: POSITIVE
Rubella Antibodies, IGG: 1.17 index (ref >0.99)
Varicella zoster IgG: 1252 index (ref >165)
WBC: 7.4 10*3/uL (ref 3.4–10.8)

## 2020-04-24 LAB — URINE CULTURE

## 2020-04-28 LAB — CYTOLOGY - PAP
Chlamydia: NEGATIVE
Comment: NEGATIVE
Comment: NEGATIVE
Comment: NORMAL
Diagnosis: NEGATIVE
Neisseria Gonorrhea: NEGATIVE
Trichomonas: NEGATIVE

## 2020-05-19 ENCOUNTER — Other Ambulatory Visit: Payer: Self-pay

## 2020-05-19 ENCOUNTER — Ambulatory Visit (INDEPENDENT_AMBULATORY_CARE_PROVIDER_SITE_OTHER): Payer: BC Managed Care – PPO | Admitting: Obstetrics and Gynecology

## 2020-05-19 VITALS — BP 123/70 | Wt 130.0 lb

## 2020-05-19 DIAGNOSIS — Z1379 Encounter for other screening for genetic and chromosomal anomalies: Secondary | ICD-10-CM

## 2020-05-19 DIAGNOSIS — Z31438 Encounter for other genetic testing of female for procreative management: Secondary | ICD-10-CM

## 2020-05-19 DIAGNOSIS — Z3491 Encounter for supervision of normal pregnancy, unspecified, first trimester: Secondary | ICD-10-CM

## 2020-05-19 DIAGNOSIS — Z3A10 10 weeks gestation of pregnancy: Secondary | ICD-10-CM

## 2020-05-19 LAB — POCT URINALYSIS DIPSTICK OB
Glucose, UA: NEGATIVE
POC,PROTEIN,UA: NEGATIVE

## 2020-05-19 NOTE — Progress Notes (Signed)
ROB - No concerns. RM 3 

## 2020-05-19 NOTE — Progress Notes (Signed)
    Routine Prenatal Care Visit  Subjective  Haley Bell is a 29 y.o. G1P0000 at [redacted]w[redacted]d being seen today for ongoing prenatal care.  She is currently monitored for the following issues for this low-risk pregnancy and has Preventative health care; GAD (generalized anxiety disorder); Insomnia; Abnormal thyroid blood test; Fatigue; Encounter for birth control pills maintenance; and Supervision of low-risk pregnancy, first trimester on their problem list.  ----------------------------------------------------------------------------------- Patient reports no complaints.   Contractions: Not present. Vag. Bleeding: None.  Movement: Absent. Denies leaking of fluid.  ----------------------------------------------------------------------------------- The following portions of the patient's history were reviewed and updated as appropriate: allergies, current medications, past family history, past medical history, past social history, past surgical history and problem list. Problem list updated.   Objective  Blood pressure 123/70, weight 130 lb (59 kg), last menstrual period 03/14/2020. Pregravid weight 126 lb (57.2 kg) Total Weight Gain 4 lb (1.814 kg) Urinalysis:      Fetal Status: Fetal Heart Rate (bpm): 178   Movement: Absent     General:  Alert, oriented and cooperative. Patient is in no acute distress.  Skin: Skin is warm and dry. No rash noted.   Cardiovascular: Normal heart rate noted  Respiratory: Normal respiratory effort, no problems with respiration noted  Abdomen: Soft, gravid, appropriate for gestational age. Pain/Pressure: Absent     Pelvic:  Cervical exam deferred        Extremities: Normal range of motion.     ental Status: Normal mood and affect. Normal behavior. Normal judgment and thought content.     Assessment   30 y.o. G1P0000 at [redacted]w[redacted]d by  12/15/2020, by Ultrasound presenting for routine prenatal visit  Plan   [email or web address]  Problems (from 03/09/20 to present)     Problem Noted Resolved   Supervision of low-risk pregnancy, first trimester 04/22/2020 by Nadara Mustard, MD No   Overview Signed 04/22/2020  1:59 PM by Nadara Mustard, MD    Clinic [facility] Prenatal Labs  Dating [place] 6 weeks Blood type:     Genetic Screen     NIPS: Antibody:   Anatomic [place]  Rubella:   Varicella: @VZVIGG @  GTT   Third trimester:  RPR:     Rhogam  HBsAg:     TDaP vaccine                       Flu Shot: HIV:     Baby Food                                GBS:   Contraception  Pap:04/2020  CBB     CS/VBAC    Support Person                Gestational age appropriate obstetric precautions including but not limited to vaginal bleeding, contractions, leaking of fluid and fetal movement were reviewed in detail with the patient.    - lab closed will obtain genetic testing tomorrow  Return in about 4 weeks (around 06/16/2020) for 4 weeks ROB, labs tomorrow [place].  06/18/2020, MD, Vena Austria [facility], [facility] 05/19/2020, 5:22 PM

## 2020-05-20 ENCOUNTER — Other Ambulatory Visit: Payer: BC Managed Care – PPO

## 2020-05-20 ENCOUNTER — Other Ambulatory Visit: Payer: Self-pay

## 2020-05-20 DIAGNOSIS — Z1379 Encounter for other screening for genetic and chromosomal anomalies: Secondary | ICD-10-CM

## 2020-05-20 DIAGNOSIS — Z3491 Encounter for supervision of normal pregnancy, unspecified, first trimester: Secondary | ICD-10-CM

## 2020-05-20 DIAGNOSIS — Z31438 Encounter for other genetic testing of female for procreative management: Secondary | ICD-10-CM

## 2020-05-20 DIAGNOSIS — Z3A10 10 weeks gestation of pregnancy: Secondary | ICD-10-CM

## 2020-05-26 LAB — MATERNIT 21 PLUS CORE, BLOOD
Fetal Fraction: 9
Result (T21): NEGATIVE
Trisomy 13 (Patau syndrome): NEGATIVE
Trisomy 18 (Edwards syndrome): NEGATIVE
Trisomy 21 (Down syndrome): NEGATIVE

## 2020-06-02 LAB — INHERITEST CORE(CF97,SMA,FRAX)

## 2020-06-18 NOTE — L&D Delivery Note (Signed)
Operative Delivery Note At 5:47 AM a viable female was delivered via Vaginal, Vacuum Investment banker, operational).  Presentation: vertex; Position: Left,, Occiput,, Anterior; Station: +3.  Verbal consent: obtained from patient.  Risks and benefits discussed in detail.  Risks include, but are not limited to the risks of anesthesia, bleeding, infection, damage to maternal tissues, fetal cephalhematoma.  There is also the risk of inability to effect vaginal delivery of the head, or shoulder dystocia that cannot be resolved by established maneuvers, leading to the need for emergency cesarean section.  However, given terminal bradycardia during the second stage we discussed benefits of expediting delivery.  Vacuum placed and on pull.push resulted in delivery of the fetal vertex application time <30 seconds.    APGAR: 8, 9; weight  .   Placenta status: spontanous, intact  Cord: 3VC with the following complications: terminal bradycardia.  Cord pH: N/A  Anesthesia:  Epidural Instruments: flat kiwi Episiotomy: none Lacerations: first degree Suture Repair: 3.0 vicryl Est. Blood Loss (mL):   Mom to postpartum.  Baby to Couplet care / Skin to Skin.  Vena Austria 12/09/2020, 6:22 AM

## 2020-06-20 ENCOUNTER — Other Ambulatory Visit: Payer: Self-pay

## 2020-06-20 ENCOUNTER — Ambulatory Visit (INDEPENDENT_AMBULATORY_CARE_PROVIDER_SITE_OTHER): Payer: BC Managed Care – PPO | Admitting: Obstetrics and Gynecology

## 2020-06-20 VITALS — BP 118/72 | Wt 138.0 lb

## 2020-06-20 DIAGNOSIS — Z363 Encounter for antenatal screening for malformations: Secondary | ICD-10-CM

## 2020-06-20 DIAGNOSIS — Z3A14 14 weeks gestation of pregnancy: Secondary | ICD-10-CM

## 2020-06-20 DIAGNOSIS — Z3491 Encounter for supervision of normal pregnancy, unspecified, first trimester: Secondary | ICD-10-CM

## 2020-06-20 LAB — POCT URINALYSIS DIPSTICK OB
Glucose, UA: NEGATIVE
POC,PROTEIN,UA: NEGATIVE

## 2020-06-20 NOTE — Progress Notes (Signed)
    Routine Prenatal Care Visit  Subjective  Haley Bell is a 30 y.o. G1P0000 at [redacted]w[redacted]d being seen today for ongoing prenatal care.  She is currently monitored for the following issues for this low-risk pregnancy and has Preventative health care; GAD (generalized anxiety disorder); Insomnia; Abnormal thyroid blood test; Fatigue; Encounter for birth control pills maintenance; and Supervision of low-risk pregnancy, first trimester on their problem list.  ----------------------------------------------------------------------------------- Patient reports no complaints.   Contractions: Not present. Vag. Bleeding: None.  Movement: Absent. Denies leaking of fluid.  ----------------------------------------------------------------------------------- The following portions of the patient's history were reviewed and updated as appropriate: allergies, current medications, past family history, past medical history, past social history, past surgical history and problem list. Problem list updated.   Objective  Blood pressure 118/72, weight 138 lb (62.6 kg), last menstrual period 03/14/2020. Pregravid weight 126 lb (57.2 kg) Total Weight Gain 12 lb (5.443 kg) Urinalysis:      Fetal Status: Fetal Heart Rate (bpm): 165   Movement: Absent     General:  Alert, oriented and cooperative. Patient is in no acute distress.  Skin: Skin is warm and dry. No rash noted.   Cardiovascular: Normal heart rate noted  Respiratory: Normal respiratory effort, no problems with respiration noted  Abdomen: Soft, gravid, appropriate for gestational age. Pain/Pressure: Absent     Pelvic:  Cervical exam deferred        Extremities: Normal range of motion.     ental Status: Normal mood and affect. Normal behavior. Normal judgment and thought content.     Assessment   30 y.o. G1P0000 at [redacted]w[redacted]d by  12/15/2020, by Ultrasound presenting for routine prenatal visit  Plan   pregnancy1  Problems (from 03/09/20 to present)     Problem Noted Resolved   Supervision of low-risk pregnancy, first trimester 04/22/2020 by Nadara Mustard, MD No   Overview Addendum 06/03/2020  8:05 AM by Vena Austria, MD    Clinic [facility] Prenatal Labs  Dating [place] 6 weeks Blood type: O pos  Genetic Screen CF neg, Fragile-X neg, SMA neg NIPS:Normal XX Antibody: negative  Anatomic [place]  Rubella: Immune Varicella: Immune  GTT   Third trimester:  RPR: NR  Rhogam N/A HBsAg: negative  TDaP vaccine                       Flu Shot: HIV: negative  Baby Food                                GBS:   Contraception  Pap:04/2020  CBB     CS/VBAC    Support Person            Previous Version       Gestational age appropriate obstetric precautions including but not limited to vaginal bleeding, contractions, leaking of fluid and fetal movement were reviewed in detail with the patient.    Return in about 4 weeks (around 07/18/2020) for ROB and anatomy scan.  Vena Austria, MD, Evern Core [facility], [facility] 06/20/2020, 4:34 PM

## 2020-07-18 ENCOUNTER — Other Ambulatory Visit: Payer: BC Managed Care – PPO

## 2020-07-18 ENCOUNTER — Encounter: Payer: BC Managed Care – PPO | Admitting: Obstetrics and Gynecology

## 2020-07-19 ENCOUNTER — Ambulatory Visit (INDEPENDENT_AMBULATORY_CARE_PROVIDER_SITE_OTHER): Payer: BC Managed Care – PPO | Admitting: Obstetrics and Gynecology

## 2020-07-19 ENCOUNTER — Ambulatory Visit (INDEPENDENT_AMBULATORY_CARE_PROVIDER_SITE_OTHER): Payer: BC Managed Care – PPO

## 2020-07-19 ENCOUNTER — Other Ambulatory Visit: Payer: Self-pay

## 2020-07-19 VITALS — BP 110/68 | Wt 143.0 lb

## 2020-07-19 DIAGNOSIS — Z3A18 18 weeks gestation of pregnancy: Secondary | ICD-10-CM

## 2020-07-19 DIAGNOSIS — Z363 Encounter for antenatal screening for malformations: Secondary | ICD-10-CM

## 2020-07-19 DIAGNOSIS — Z3491 Encounter for supervision of normal pregnancy, unspecified, first trimester: Secondary | ICD-10-CM

## 2020-07-19 DIAGNOSIS — Z362 Encounter for other antenatal screening follow-up: Secondary | ICD-10-CM

## 2020-07-19 NOTE — Progress Notes (Signed)
Routine Prenatal Care Visit  Subjective  Haley Bell is a 30 y.o. G1P0000 at [redacted]w[redacted]d being seen today for ongoing prenatal care.  She is currently monitored for the following issues for this low-risk pregnancy and has Preventative health care; GAD (generalized anxiety disorder); Insomnia; Abnormal thyroid blood test; Fatigue; Encounter for birth control pills maintenance; and Supervision of low-risk pregnancy, first trimester on their problem list.  ----------------------------------------------------------------------------------- Patient reports no complaints.   Contractions: Not present. Vag. Bleeding: None.  Movement: Present. Denies leaking of fluid.  ----------------------------------------------------------------------------------- The following portions of the patient's history were reviewed and updated as appropriate: allergies, current medications, past family history, past medical history, past social history, past surgical history and problem list. Problem list updated.   Objective  Blood pressure 110/68, weight 143 lb (64.9 kg), last menstrual period 03/14/2020. Pregravid weight 126 lb (57.2 kg) Total Weight Gain 17 lb (7.711 kg) Urinalysis:      Fetal Status: Fetal Heart Rate (bpm): 155   Movement: Present     General:  Alert, oriented and cooperative. Patient is in no acute distress.  Skin: Skin is warm and dry. No rash noted.   Cardiovascular: Normal heart rate noted  Respiratory: Normal respiratory effort, no problems with respiration noted  Abdomen: Soft, gravid, appropriate for gestational age. Pain/Pressure: Absent     Pelvic:  Cervical exam deferred        Extremities: Normal range of motion.     ental Status: Normal mood and affect. Normal behavior. Normal judgment and thought content.   US OB Comp + 14 Wk  Result Date: 07/19/2020 Patient Name: Haley Bell DOB: 1991/04/05 MRN: [id number] ULTRASOUND REPORT Location: [facility] Date of Service:  07/19/2020 Indications:Anatomy Ultrasound Findings: Mason Jim intrauterine pregnancy is visualized with positive fetal heart tones. Biometrics give an (U/S) Gestational age of [redacted]w[redacted]d and an (U/S) EDD of 12/17/2020; this correlates with the clinically established Estimated Date of Delivery: 12/15/20 Fetal presentation is Cephalic. EFW: 241 g ( 9 oz ). Placenta: anterior right. Grade: 1 AFI: subjectively normal. Anatomic survey is incomplete for CSP, Orbits, Fetal Profile, Nose/Lips, LVOT, RVOT, DA, Upper Extremities( left hand and right arm/hand) and normal; Gender - female.  Impression: 1. [redacted]w[redacted]d Viable Singleton Intrauterine pregnancy by U/S. 2. (U/S) EDD is consistent with Clinically established Estimated Date of Delivery: 12/15/20, [redacted]w[redacted]d . 3. Incomplete Anatomy Scan. Recommendations: 1.Clinical correlation with the patient's History and Physical Exam. Deanna Artis, RT  There is a singleton gestation with subjectively normal amniotic fluid volume. The fetal biometry correlates with established dating. Detailed evaluation of the fetal anatomy was performed.The fetal anatomical survey appears within normal limits within the resolution of ultrasound as described above.  Study remains incomplete today for the aboce noted structures.  It must be noted that a normal ultrasound is unable to rule out fetal aneuploidy, subtle defects such as small ASD or VDS may also not be visible on imaging.  Vena Austria, MD, FACOG [facility], [facility] 07/19/2020, 10:54 AM     Assessment   29 y.o. G1P0000 at [redacted]w[redacted]d by  12/15/2020, by Ultrasound presenting for routine prenatal visit  Plan   pregnancy1  Problems (from 03/09/20 to present)    Problem Noted Resolved   Supervision of low-risk pregnancy, first trimester 04/22/2020 by Nadara Mustard, MD No   Overview Addendum 06/03/2020  8:05 AM by Vena Austria, MD    Clinic [facility] Prenatal Labs  Dating [place] 6 weeks Blood type: O pos  Genetic Screen  CF neg, Fragile-X neg, SMA neg NIPS:Normal XX Antibody: negative  Anatomic [place]  Rubella: Immune Varicella: Immune  GTT   Third trimester:  RPR: NR  Rhogam N/A HBsAg: negative  TDaP vaccine                       Flu Shot: HIV: negative  Baby Food                                GBS:   Contraception  Pap:04/2020  CBB     CS/VBAC    Support Person            Previous Version       Gestational age appropriate obstetric precautions including but not limited to vaginal bleeding, contractions, leaking of fluid and fetal movement were reviewed in detail with the patient.    - anatomy scan incomplete secondary to position today follow up in 2 weeks  Return in about 2 weeks (around 08/02/2020) for 2 week ROB and follow up anatomy scan.  Vena Austria, MD, Evern Core [facility], [facility] 07/19/2020, 11:09 AM

## 2020-08-01 ENCOUNTER — Ambulatory Visit (INDEPENDENT_AMBULATORY_CARE_PROVIDER_SITE_OTHER): Payer: BC Managed Care – PPO

## 2020-08-01 ENCOUNTER — Other Ambulatory Visit: Payer: Self-pay

## 2020-08-01 ENCOUNTER — Ambulatory Visit (INDEPENDENT_AMBULATORY_CARE_PROVIDER_SITE_OTHER): Payer: BC Managed Care – PPO | Admitting: Obstetrics and Gynecology

## 2020-08-01 VITALS — BP 110/62 | Wt 142.0 lb

## 2020-08-01 DIAGNOSIS — Z3491 Encounter for supervision of normal pregnancy, unspecified, first trimester: Secondary | ICD-10-CM

## 2020-08-01 DIAGNOSIS — Z3492 Encounter for supervision of normal pregnancy, unspecified, second trimester: Secondary | ICD-10-CM

## 2020-08-01 DIAGNOSIS — Z362 Encounter for other antenatal screening follow-up: Secondary | ICD-10-CM

## 2020-08-01 DIAGNOSIS — Z3A20 20 weeks gestation of pregnancy: Secondary | ICD-10-CM

## 2020-08-01 LAB — POCT URINALYSIS DIPSTICK OB
Glucose, UA: NEGATIVE
POC,PROTEIN,UA: NEGATIVE

## 2020-08-01 NOTE — Progress Notes (Signed)
Routine Prenatal Care Visit  Subjective  Haley Bell is a 30 y.o. G1P0000 at [redacted]w[redacted]d being seen today for ongoing prenatal care.  She is currently monitored for the following issues for this low-risk pregnancy and has Preventative health care; GAD (generalized anxiety disorder); Insomnia; Abnormal thyroid blood test; Fatigue; Encounter for birth control pills maintenance; and Supervision of low-risk pregnancy, first trimester on their problem list.  ----------------------------------------------------------------------------------- Patient reports no complaints.   Contractions: Not present. Vag. Bleeding: None.  Movement: Present. Denies leaking of fluid.  ----------------------------------------------------------------------------------- The following portions of the patient's history were reviewed and updated as appropriate: allergies, current medications, past family history, past medical history, past social history, past surgical history and problem list. Problem list updated.   Objective  Blood pressure 110/62, weight 142 lb (64.4 kg), last menstrual period 03/14/2020. Pregravid weight 126 lb (57.2 kg) Total Weight Gain 16 lb (7.258 kg) Urinalysis:      Fetal Status: Fetal Heart Rate (bpm): 150   Movement: Present     General:  Alert, oriented and cooperative. Patient is in no acute distress.  Skin: Skin is warm and dry. No rash noted.   Cardiovascular: Normal heart rate noted  Respiratory: Normal respiratory effort, no problems with respiration noted  Abdomen: Soft, gravid, appropriate for gestational age. Pain/Pressure: Absent     Pelvic:  Cervical exam deferred        Extremities: Normal range of motion.     ental Status: Normal mood and affect. Normal behavior. Normal judgment and thought content.   US OB Comp + 14 Wk  Result Date: 07/19/2020 Patient Name: Haley Bell DOB: 20-May-1991 MRN: [id number] ULTRASOUND REPORT Location: [facility] Date of Service:  07/19/2020 Indications:Anatomy Ultrasound Findings: Mason Jim intrauterine pregnancy is visualized with positive fetal heart tones. Biometrics give an (U/S) Gestational age of [redacted]w[redacted]d and an (U/S) EDD of 12/17/2020; this correlates with the clinically established Estimated Date of Delivery: 12/15/20 Fetal presentation is Cephalic. EFW: 241 g ( 9 oz ). Placenta: anterior right. Grade: 1 AFI: subjectively normal. Anatomic survey is incomplete for CSP, Orbits, Fetal Profile, Nose/Lips, LVOT, RVOT, DA, Upper Extremities( left hand and right arm/hand) and normal; Gender - female.  Impression: 1. [redacted]w[redacted]d Viable Singleton Intrauterine pregnancy by U/S. 2. (U/S) EDD is consistent with Clinically established Estimated Date of Delivery: 12/15/20, [redacted]w[redacted]d . 3. Incomplete Anatomy Scan. Recommendations: 1.Clinical correlation with the patient's History and Physical Exam. Deanna Artis, RT  There is a singleton gestation with subjectively normal amniotic fluid volume. The fetal biometry correlates with established dating. Detailed evaluation of the fetal anatomy was performed.The fetal anatomical survey appears within normal limits within the resolution of ultrasound as described above.  Study remains incomplete today for the aboce noted structures.  It must be noted that a normal ultrasound is unable to rule out fetal aneuploidy, subtle defects such as small ASD or VDS may also not be visible on imaging.  Vena Austria, MD, [facility], [facility] 07/19/2020, 10:54 AM   US OB Follow Up  Result Date: 08/01/2020 Patient Name: Haley Bell DOB: Apr 27, 1991 MRN: [id number] ULTRASOUND REPORT Location: [facility] Date of Service: 08/01/2020 Indications: Anatomy follow up ultrasound Findings: Mason Jim intrauterine pregnancy is visualized with FHR at 156 BPM. Fetal presentation is Cephalic. Placenta: anterior. Grade: 1 AFI: subjectively normal. Anatomic survey is incomplete for DA and Fetal Profile. CSP,  Orbits, Nose/Lips, LVOT, RVOT, Left Hand, and Right Arm and hand were seen today. There is  no free peritoneal fluid in the cul de sac. Impression: 1. [redacted]w[redacted]d Viable Singleton Intrauterine pregnancy previously established criteria. 2. Normal Anatomy Scan is still incomplete for DA and Fetal Profile Recommendations: 1.Clinical correlation with the patient's History and Physical Exam. Deanna Artis, RT There is a singleton gestation with subjectively normal amniotic fluid volume.  Limited evaluation of the fetal anatomy was performed today, focusing on on anatomic structures not fully visualized at the time of prior study.The visualized fetal anatomical survey appears within normal limits within the resolution of ultrasound as described above, and the anatomic survey is is complete with the exception of the ductal arch and fetal profile.  It must be noted that a normal ultrasound is unable to rule out fetal aneuploidy, subtle defects such as small ASD or VDS may also not be visible on imaging. Vena Austria, MD, Evern [facility], [facility] 08/01/2020, 9:27 AM     Assessment   29 y.o. G1P0000 at [redacted]w[redacted]d by  12/15/2020, by Ultrasound presenting for routine prenatal visit  Plan   pregnancy1  Problems (from 03/09/20 to present)    Problem Noted Resolved   Supervision of low-risk pregnancy, first trimester 04/22/2020 by Nadara Mustard, MD No   Overview Addendum 07/19/2020 11:09 AM by Vena Austria, MD    Clinic [facility] Prenatal Labs  Dating [place] 6 weeks Blood type: O pos  Genetic Screen CF neg, Fragile-X neg, SMA neg NIPS:Normal XX Antibody: negative  Anatomic [place] [ ]  incomplete 07/19/2020 Rubella: Immune Varicella: Immune  GTT   Third trimester:  RPR: NR  Rhogam N/A HBsAg: negative  TDaP vaccine                       Flu Shot: HIV: negative  Baby Food                                GBS:   Contraception  Pap:04/2020  CBB     CS/VBAC    Support Person            Previous  Version       Gestational age appropriate obstetric precautions including but not limited to vaginal bleeding, contractions, leaking of fluid and fetal movement were reviewed in detail with the patient.    - anatomy scan most missing views seen today with exception of ductal arch and profile  Return in about 4 weeks (around 08/29/2020) for ROB and follow up anatomy scan.  08/31/2020, MD, [facility], [facility] 08/01/2020, 9:41 AM

## 2020-08-23 ENCOUNTER — Other Ambulatory Visit: Payer: Self-pay

## 2020-08-23 ENCOUNTER — Encounter: Payer: Self-pay | Admitting: Obstetrics & Gynecology

## 2020-08-23 ENCOUNTER — Ambulatory Visit (INDEPENDENT_AMBULATORY_CARE_PROVIDER_SITE_OTHER): Payer: BC Managed Care – PPO | Admitting: Obstetrics & Gynecology

## 2020-08-23 ENCOUNTER — Ambulatory Visit (INDEPENDENT_AMBULATORY_CARE_PROVIDER_SITE_OTHER): Payer: BC Managed Care – PPO

## 2020-08-23 VITALS — BP 102/70 | Wt 151.0 lb

## 2020-08-23 DIAGNOSIS — Z131 Encounter for screening for diabetes mellitus: Secondary | ICD-10-CM

## 2020-08-23 DIAGNOSIS — Z362 Encounter for other antenatal screening follow-up: Secondary | ICD-10-CM

## 2020-08-23 DIAGNOSIS — Z3491 Encounter for supervision of normal pregnancy, unspecified, first trimester: Secondary | ICD-10-CM

## 2020-08-23 DIAGNOSIS — Z3492 Encounter for supervision of normal pregnancy, unspecified, second trimester: Secondary | ICD-10-CM

## 2020-08-23 DIAGNOSIS — Z3A23 23 weeks gestation of pregnancy: Secondary | ICD-10-CM

## 2020-08-23 LAB — POCT URINALYSIS DIPSTICK OB
Glucose, UA: NEGATIVE
POC,PROTEIN,UA: NEGATIVE

## 2020-08-23 NOTE — Patient Instructions (Signed)
Thank you for choosing [facility]. As part of our ongoing efforts to improve patient experience, we would appreciate your feedback. Please fill out the short survey that you will receive by mail or MyChart. Your opinion is important to us! -Dr Sabeen Piechocki  Second Trimester of Pregnancy  The second trimester of pregnancy is from week 13 through week 27. This is months 4 through 6 of pregnancy. The second trimester is often a time when you feel your best. Your body has adjusted to being pregnant, and you begin to feel better physically. During the second trimester:  Morning sickness has lessened or stopped completely.  You may have more energy.  You may have an increase in appetite. The second trimester is also a time when the unborn baby (fetus) is growing rapidly. At the end of the sixth month, the fetus may be up to 12 inches long and weigh about 1 pounds. You will likely begin to feel the baby move (quickening) between 16 and 20 weeks of pregnancy. Body changes during your second trimester Your body continues to go through many changes during your second trimester. The changes vary and generally return to normal after the baby is born. Physical changes  Your weight will continue to increase. You will notice your lower abdomen bulging out.  You may begin to get stretch marks on your hips, abdomen, and breasts.  Your breasts will continue to grow and to become tender.  Dark spots or blotches (chloasma or mask of pregnancy) may develop on your face.  A dark line from your belly button to the pubic area (linea nigra) may appear.  You may have changes in your hair. These can include thickening of your hair, rapid growth, and changes in texture. Some people also have hair loss during or after pregnancy, or hair that feels dry or thin. Health changes  You may develop headaches.  You may have heartburn.  You may develop constipation.  You may develop hemorrhoids or swollen, bulging  veins (varicose veins).  Your gums may bleed and may be sensitive to brushing and flossing.  You may urinate more often because the fetus is pressing on your bladder.  You may have back pain. This is caused by: ? Weight gain. ? Pregnancy hormones that are relaxing the joints in your pelvis. ? A shift in weight and the muscles that support your balance. Follow these instructions at home: Medicines  Follow your health care provider's instructions regarding medicine use. Specific medicines may be either safe or unsafe to take during pregnancy. Do not take any medicines unless approved by your health care provider.  Take a prenatal vitamin that contains at least 600 micrograms (mcg) of folic acid. Eating and drinking  Eat a healthy diet that includes fresh fruits and vegetables, whole grains, good sources of protein such as meat, eggs, or tofu, and low-fat dairy products.  Avoid raw meat and unpasteurized juice, milk, and cheese. These carry germs that can harm you and your baby.  You may need to take these actions to prevent or treat constipation: ? Drink enough fluid to keep your urine pale yellow. ? Eat foods that are high in fiber, such as beans, whole grains, and fresh fruits and vegetables. ? Limit foods that are high in fat and processed sugars, such as fried or sweet foods. Activity  Exercise only as directed by your health care provider. Most people can continue their usual exercise routine during pregnancy. Try to exercise for 30 minutes at least   5 days a week. Stop exercising if you develop contractions in your uterus.  Stop exercising if you develop pain or cramping in the lower abdomen or lower back.  Avoid exercising if it is very hot or humid or if you are at a high altitude.  Avoid heavy lifting.  If you choose to, you may have sex unless your health care provider tells you not to. Relieving pain and discomfort  Wear a supportive bra to prevent discomfort from  breast tenderness.  Take warm sitz baths to soothe any pain or discomfort caused by hemorrhoids. Use hemorrhoid cream if your health care provider approves.  Rest with your legs raised (elevated) if you have leg cramps or low back pain.  If you develop varicose veins: ? Wear support hose as told by your health care provider. ? Elevate your feet for 15 minutes, 3-4 times a day. ? Limit salt in your diet. Safety  Wear your seat belt at all times when driving or riding in a car.  Talk with your health care provider if someone is verbally or physically abusive to you. Lifestyle  Do not use hot tubs, steam rooms, or saunas.  Do not douche. Do not use tampons or scented sanitary pads.  Avoid cat litter boxes and soil used by cats. These carry germs that can cause birth defects in the baby and possibly loss of the fetus by miscarriage or stillbirth.  Do not use herbal remedies, alcohol, illegal drugs, or medicines that are not approved by your health care provider. Chemicals in these products can harm your baby.  Do not use any products that contain nicotine or tobacco, such as cigarettes, e-cigarettes, and chewing tobacco. If you need help quitting, ask your health care provider. General instructions  During a routine prenatal visit, your health care provider will do a physical exam and other tests. He or she will also discuss your overall health. Keep all follow-up visits. This is important.  Ask your health care provider for a referral to a local prenatal education class.  Ask for help if you have counseling or nutritional needs during pregnancy. Your health care provider can offer advice or refer you to specialists for help with various needs. Where to find more information  American Pregnancy Association: americanpregnancy.org  American College of Obstetricians and Gynecologists: acog.org/en/Womens%20Health/Pregnancy  Office on Women's Health: womenshealth.gov/pregnancy Contact  a health care provider if you have:  A headache that does not go away when you take medicine.  Vision changes or you see spots in front of your eyes.  Mild pelvic cramps, pelvic pressure, or nagging pain in the abdominal area.  Persistent nausea, vomiting, or diarrhea.  A bad-smelling vaginal discharge or foul-smelling urine.  Pain when you urinate.  Sudden or extreme swelling of your face, hands, ankles, feet, or legs.  A fever. Get help right away if you:  Have fluid leaking from your vagina.  Have spotting or bleeding from your vagina.  Have severe abdominal cramping or pain.  Have difficulty breathing.  Have chest pain.  Have fainting spells.  Have not felt your baby move for the time period told by your health care provider.  Have new or increased pain, swelling, or redness in an arm or leg. Summary  The second trimester of pregnancy is from week 13 through week 27 (months 4 through 6).  Do not use herbal remedies, alcohol, illegal drugs, or medicines that are not approved by your health care provider. Chemicals in these products can   harm your baby.  Exercise only as directed by your health care provider. Most people can continue their usual exercise routine during pregnancy.  Keep all follow-up visits. This is important. This information is not intended to replace advice given to you by your health care provider. Make sure you discuss any questions you have with your health care provider. Document Revised: 11/11/2019 Document Reviewed: 09/17/2019 Elsevier Patient Education  2021 Elsevier Inc.  

## 2020-08-23 NOTE — Progress Notes (Signed)
  Subjective  Fetal Movement? yes Contractions? no Leaking Fluid? no Vaginal Bleeding? no  Objective  BP 102/70   Wt 151 lb (68.5 kg)   LMP 03/14/2020   BMI 23.65 kg/m  General: NAD Pumonary: no increased work of breathing Abdomen: gravid, non-tender Extremities: no edema Psychiatric: mood appropriate, affect full  Assessment  30 y.o. G1P0000 at [redacted]w[redacted]d by  12/15/2020, by Ultrasound presenting for routine prenatal visit  Plan   Problem List Items Addressed This Visit      Other   Supervision of low-risk pregnancy, first trimester    Other Visit Diagnoses    [redacted] weeks gestation of pregnancy    -  Primary   Encounter for follow-up ultrasound of fetal anatomy       Screening for diabetes mellitus       Relevant Orders   28 Week RH+Panel      pregnancy1  Problems (from 03/09/20 to present)    Problem Noted Resolved   Supervision of low-risk pregnancy, first trimester 04/22/2020 by Nadara Mustard, MD No   Overview Addendum 08/23/2020  3:57 PM by Nadara Mustard, MD    Clinic [facility] Prenatal Labs  Dating [place] 6 weeks Blood type: O pos  Genetic Screen CF neg, Fragile-X neg, SMA neg NIPS:Normal XX Antibody: negative  Anatomic [place] [facility] nml w recheck Rubella: Immune Varicella: Immune  GTT   Third trimester:  RPR: NR  Rhogam N/A HBsAg: negative  TDaP vaccine           Flu Shot: HIV: negative  Baby Food           Breast                     GBS:   Contraception            POP Pap:04/2020  CBB  no   CS/VBAC n/a   Support Person Husband       PNV, [facility] Glucola nv Review of ULTRASOUND. I have personally reviewed images and report of recent ultrasound done at [facility]. There is a singleton gestation with subjectively normal amniotic fluid volume. The fetal biometry correlates with established dating. Detailed evaluation of the fetal anatomy was performed.The fetal anatomical survey appears within normal limits within the resolution of ultrasound as described above.  It must be  noted that a normal ultrasound is unable to rule out fetal aneuploidy.     Annamarie Major, MD, [facility], [facility] 08/23/2020  3:58 PM

## 2020-08-23 NOTE — Addendum Note (Signed)
Addended by: Cornelius Moras D on: 08/23/2020 04:00 PM   Modules accepted: Orders

## 2020-08-29 ENCOUNTER — Encounter: Payer: BC Managed Care – PPO | Admitting: Obstetrics and Gynecology

## 2020-08-29 ENCOUNTER — Ambulatory Visit: Payer: BC Managed Care – PPO

## 2020-08-29 DIAGNOSIS — Z3491 Encounter for supervision of normal pregnancy, unspecified, first trimester: Secondary | ICD-10-CM

## 2020-08-29 DIAGNOSIS — Z362 Encounter for other antenatal screening follow-up: Secondary | ICD-10-CM

## 2020-09-20 ENCOUNTER — Ambulatory Visit (INDEPENDENT_AMBULATORY_CARE_PROVIDER_SITE_OTHER): Payer: BC Managed Care – PPO | Admitting: Obstetrics & Gynecology

## 2020-09-20 ENCOUNTER — Other Ambulatory Visit: Payer: BC Managed Care – PPO

## 2020-09-20 ENCOUNTER — Other Ambulatory Visit: Payer: Self-pay

## 2020-09-20 ENCOUNTER — Encounter: Payer: Self-pay | Admitting: Obstetrics & Gynecology

## 2020-09-20 ENCOUNTER — Encounter: Payer: BC Managed Care – PPO | Admitting: Obstetrics & Gynecology

## 2020-09-20 VITALS — BP 108/70 | Wt 154.0 lb

## 2020-09-20 DIAGNOSIS — Z3492 Encounter for supervision of normal pregnancy, unspecified, second trimester: Secondary | ICD-10-CM

## 2020-09-20 DIAGNOSIS — Z3A27 27 weeks gestation of pregnancy: Secondary | ICD-10-CM

## 2020-09-20 LAB — POCT URINALYSIS DIPSTICK OB
Glucose, UA: NEGATIVE
POC,PROTEIN,UA: NEGATIVE

## 2020-09-20 NOTE — Progress Notes (Signed)
  Subjective  Fetal Movement? yes Contractions? no Leaking Fluid? no Vaginal Bleeding? no  Objective  BP 108/70   Wt 154 lb (69.9 kg)   LMP 03/14/2020   BMI 24.12 kg/m  General: NAD Pumonary: no increased work of breathing Abdomen: gravid, non-tender Extremities: no edema Psychiatric: mood appropriate, affect full  Assessment  30 y.o. G1P0000 at [redacted]w[redacted]d by  12/15/2020, by Ultrasound presenting for routine prenatal visit  Plan   Problem List Items Addressed This Visit     [redacted] weeks gestation of pregnancy    -  Primary   Relevant Orders   POC Urinalysis Dipstick OB (Completed)   Supervision of low-risk pregnancy, second trimester        PNV,FMC, PTL precautions Childbirth classes- pt desires Glucola scheduled soon  pregnancy1  Problems (from 03/09/20 to present)    Problem Noted Resolved   Supervision of low-risk pregnancy, first trimester 04/22/2020 by Nadara Mustard, MD No   Overview Addendum 08/23/2020  3:57 PM by Nadara Mustard, MD    [facility]inic [facility] Prenatal Labs  Dating [place] 6 weeks Blood type: O pos  Genetic Screen CF neg, Fragile-X neg, SMA neg NIPS:Normal XX Antibody: negative  Anatomic [place] [facility] nml w recheck Rubella: Immune Varicella: Immune  GTT   Third trimester:  RPR: NR  Rhogam N/A HBsAg: negative  TDaP vaccine           Flu Shot: HIV: negative  Baby Food           Breast                     GBS:   Contraception            POP Pap:04/2020  CBB  no   CS/VBAC n/a   Support Person Husband          Annamarie Major, MD, [facility], [facility] 09/20/2020  4:53 PM

## 2020-09-20 NOTE — Patient Instructions (Signed)

## 2020-10-04 ENCOUNTER — Other Ambulatory Visit: Payer: Self-pay

## 2020-10-04 ENCOUNTER — Other Ambulatory Visit: Payer: BC Managed Care – PPO

## 2020-10-04 DIAGNOSIS — Z131 Encounter for screening for diabetes mellitus: Secondary | ICD-10-CM

## 2020-10-05 ENCOUNTER — Other Ambulatory Visit: Payer: Self-pay | Admitting: Obstetrics

## 2020-10-05 DIAGNOSIS — R7309 Other abnormal glucose: Secondary | ICD-10-CM | POA: Insufficient documentation

## 2020-10-05 DIAGNOSIS — Z3491 Encounter for supervision of normal pregnancy, unspecified, first trimester: Secondary | ICD-10-CM

## 2020-10-05 LAB — 28 WEEK RH+PANEL
Basophils Absolute: 0 10*3/uL (ref 0.0–0.2)
Basos: 0 %
EOS (ABSOLUTE): 0.1 10*3/uL (ref 0.0–0.4)
Eos: 1 %
Gestational Diabetes Screen: 163 mg/dL — ABNORMAL HIGH (ref 65–139)
HIV Screen 4th Generation wRfx: NONREACTIVE
Hematocrit: 34.2 % (ref 34.0–46.6)
Hemoglobin: 11.7 g/dL (ref 11.1–15.9)
Immature Grans (Abs): 0.2 10*3/uL — ABNORMAL HIGH (ref 0.0–0.1)
Immature Granulocytes: 2 %
Lymphocytes Absolute: 1.2 10*3/uL (ref 0.7–3.1)
Lymphs: 11 %
MCH: 35.1 pg — ABNORMAL HIGH (ref 26.6–33.0)
MCHC: 34.2 g/dL (ref 31.5–35.7)
MCV: 103 fL — ABNORMAL HIGH (ref 79–97)
Monocytes Absolute: 0.9 10*3/uL (ref 0.1–0.9)
Monocytes: 8 %
Neutrophils Absolute: 8.1 10*3/uL — ABNORMAL HIGH (ref 1.4–7.0)
Neutrophils: 78 %
Platelets: 143 10*3/uL — ABNORMAL LOW (ref 150–450)
RBC: 3.33 x10E6/uL — ABNORMAL LOW (ref 3.77–5.28)
RDW: 11.4 % — ABNORMAL LOW (ref 11.7–15.4)
RPR Ser Ql: NONREACTIVE
WBC: 10.4 10*3/uL (ref 3.4–10.8)

## 2020-10-05 NOTE — Telephone Encounter (Signed)
Please advise 

## 2020-10-06 ENCOUNTER — Other Ambulatory Visit: Payer: Self-pay | Admitting: Obstetrics & Gynecology

## 2020-10-06 DIAGNOSIS — O99810 Abnormal glucose complicating pregnancy: Secondary | ICD-10-CM

## 2020-10-06 NOTE — Progress Notes (Signed)
Sch 3 hour GTT, for recent abnormal glucola testing in pregnancy.

## 2020-10-10 ENCOUNTER — Encounter: Payer: BC Managed Care – PPO | Admitting: Obstetrics and Gynecology

## 2020-10-10 ENCOUNTER — Other Ambulatory Visit: Payer: Self-pay

## 2020-10-10 ENCOUNTER — Ambulatory Visit (INDEPENDENT_AMBULATORY_CARE_PROVIDER_SITE_OTHER): Payer: BC Managed Care – PPO | Admitting: Obstetrics and Gynecology

## 2020-10-10 VITALS — BP 110/68 | Wt 161.0 lb

## 2020-10-10 DIAGNOSIS — Z23 Encounter for immunization: Secondary | ICD-10-CM

## 2020-10-10 DIAGNOSIS — Z3A30 30 weeks gestation of pregnancy: Secondary | ICD-10-CM

## 2020-10-10 DIAGNOSIS — Z3493 Encounter for supervision of normal pregnancy, unspecified, third trimester: Secondary | ICD-10-CM

## 2020-10-10 DIAGNOSIS — R7309 Other abnormal glucose: Secondary | ICD-10-CM

## 2020-10-10 DIAGNOSIS — Z3491 Encounter for supervision of normal pregnancy, unspecified, first trimester: Secondary | ICD-10-CM

## 2020-10-10 LAB — POCT URINALYSIS DIPSTICK OB
Glucose, UA: NEGATIVE
POC,PROTEIN,UA: NEGATIVE

## 2020-10-10 NOTE — Progress Notes (Signed)
    Routine Prenatal Care Visit  Subjective  Haley Bell is a 30 y.o. G1P0000 at [redacted]w[redacted]d being seen today for ongoing prenatal care.  She is currently monitored for the following issues for this low-risk pregnancy and has Preventative health care; GAD (generalized anxiety disorder); Insomnia; Abnormal thyroid blood test; Fatigue; Encounter for birth control pills maintenance; Supervision of low-risk pregnancy, first trimester; and Elevated glucose tolerance test on their problem list.  ----------------------------------------------------------------------------------- Patient reports no complaints.   Contractions: Not present. Vag. Bleeding: None.  Movement: Present. Denies leaking of fluid.  ----------------------------------------------------------------------------------- The following portions of the patient's history were reviewed and updated as appropriate: allergies, current medications, past family history, past medical history, past social history, past surgical history and problem list. Problem list updated.   Objective  Blood pressure 110/68, weight 161 lb (73 kg), last menstrual period 03/14/2020. Pregravid weight 126 lb (57.2 kg) Total Weight Gain 35 lb (15.9 kg) Urinalysis:      Fetal Status: Fetal Heart Rate (bpm): 145 Fundal Height: 30 cm Movement: Present  Presentation: Vertex  General:  Alert, oriented and cooperative. Patient is in no acute distress.  Skin: Skin is warm and dry. No rash noted.   Cardiovascular: Normal heart rate noted  Respiratory: Normal respiratory effort, no problems with respiration noted  Abdomen: Soft, gravid, appropriate for gestational age. Pain/Pressure: Present     Pelvic:  Cervical exam deferred        Extremities: Normal range of motion.     ental Status: Normal mood and affect. Normal behavior. Normal judgment and thought content.     Assessment   30 y.o. G1P0000 at 101w4d by  12/15/2020, by Ultrasound presenting for routine prenatal  visit  Plan   pregnancy1  Problems (from 03/09/20 to present)    Problem Noted Resolved   Elevated glucose tolerance test 10/05/2020 by Mirna Mires, CNM No   Overview Signed 10/05/2020  5:40 PM by Mirna Mires, CNM    4/20 1hr GTT result 163. 3 hr test ordered and patient notified via My Chart as her VM was full.      Supervision of low-risk pregnancy, first trimester 04/22/2020 by Nadara Mustard, MD No   Overview Addendum 10/05/2020  5:39 PM by Mirna Mires, CNM    Clinic [facility] Prenatal Labs  Dating [place] 6 weeks Blood type: O pos  Genetic Screen CF neg, Fragile-X neg, SMA neg NIPS:Normal XX Antibody: negative  Anatomic [place] [facility] nml w recheck Rubella: Immune Varicella: Immune  GTT   Third trimester: 163. 3hr ordered 4/20 RPR: NR  Rhogam N/A HBsAg: negative  TDaP vaccine 10/10/20  Flu Shot: HIV: negative  Baby Food           Breast                     GBS:   Contraception            POP Pap:04/2020  CBB  no   CS/VBAC n/a   Support Person Husband           Previous Version       Gestational age appropriate obstetric precautions including but not limited to vaginal bleeding, contractions, leaking of fluid and fetal movement were reviewed in detail with the patient.    Return in about 2 weeks (around 10/24/2020) for ROB ([name]).  Vena Austria, MD, [facility], [facility] 10/10/2020, 4:59 PM

## 2020-10-12 ENCOUNTER — Other Ambulatory Visit: Payer: BC Managed Care – PPO

## 2020-10-12 ENCOUNTER — Other Ambulatory Visit: Payer: Self-pay

## 2020-10-12 DIAGNOSIS — R7309 Other abnormal glucose: Secondary | ICD-10-CM

## 2020-10-12 DIAGNOSIS — O99810 Abnormal glucose complicating pregnancy: Secondary | ICD-10-CM

## 2020-10-13 ENCOUNTER — Other Ambulatory Visit: Payer: Self-pay | Admitting: Obstetrics & Gynecology

## 2020-10-13 DIAGNOSIS — O24410 Gestational diabetes mellitus in pregnancy, diet controlled: Secondary | ICD-10-CM

## 2020-10-13 LAB — GESTATIONAL GLUCOSE TOLERANCE
Glucose, Fasting: 70 mg/dL (ref 65–94)
Glucose, GTT - 1 Hour: 146 mg/dL (ref 65–179)
Glucose, GTT - 2 Hour: 169 mg/dL — ABNORMAL HIGH (ref 65–154)
Glucose, GTT - 3 Hour: 160 mg/dL — ABNORMAL HIGH (ref 65–139)

## 2020-10-18 ENCOUNTER — Encounter: Payer: Self-pay | Admitting: *Deleted

## 2020-10-18 ENCOUNTER — Other Ambulatory Visit: Payer: Self-pay

## 2020-10-18 ENCOUNTER — Encounter: Payer: BC Managed Care – PPO | Attending: Obstetrics & Gynecology | Admitting: *Deleted

## 2020-10-18 VITALS — BP 100/70 | Ht 67.0 in | Wt 160.2 lb

## 2020-10-18 DIAGNOSIS — O24410 Gestational diabetes mellitus in pregnancy, diet controlled: Secondary | ICD-10-CM

## 2020-10-18 DIAGNOSIS — Z3A00 Weeks of gestation of pregnancy not specified: Secondary | ICD-10-CM | POA: Diagnosis not present

## 2020-10-18 NOTE — Progress Notes (Signed)
Diabetes Self-Management Education  Visit Type: First/Initial  Appt. Start Time: 0825 Appt. End Time: 0950  10/18/2020  [name], identified by name and date of birth, is a 30 y.o. female with a diagnosis of Diabetes: Gestational Diabetes.   ASSESSMENT  Blood pressure 100/70, height 5\' 7"  (1.702 m), weight 160 lb 3.2 oz (72.7 kg), last menstrual period 03/14/2020, estimated date of delivery 12/15/2020 Body mass index is 25.09 kg/m.   Diabetes Self-Management Education - 10/18/20 1026      Visit Information   Visit Type First/Initial      Initial Visit   Diabetes Type Gestational Diabetes    Are you currently following a meal plan? Yes    What type of meal plan do you follow? "low carb"    Are you taking your medications as prescribed? Yes    Date Diagnosed week ago      Health Coping   How would you rate your overall health? Good      Psychosocial Assessment   Patient Belief/Attitude about Diabetes Motivated to manage diabetes   "unsure"   Self-care barriers None    Self-management support Doctor's office;Family    Other persons present Spouse/SO    Patient Concerns Nutrition/Meal planning;Glycemic Control;Monitoring    Special Needs None    Preferred Learning Style Auditory;Visual;Hands on    Learning Readiness Change in progress    How often do you need to have someone help you when you read instructions, pamphlets, or other written materials from your doctor or pharmacy? 1 - Never    What is the last grade level you completed in school? Bachelors      Pre-Education Assessment   Patient understands the diabetes disease and treatment process. Needs Instruction    Patient understands incorporating nutritional management into lifestyle. Needs Instruction    Patient undertands incorporating physical activity into lifestyle. Needs Instruction    Patient understands using medications safely. Needs Instruction    Patient understands monitoring blood glucose,  interpreting and using results Needs Instruction    Patient understands prevention, detection, and treatment of acute complications. Needs Instruction    Patient understands prevention, detection, and treatment of chronic complications. Needs Instruction    Patient understands how to develop strategies to address psychosocial issues. Needs Instruction    Patient understands how to develop strategies to promote health/change behavior. Needs Instruction      Complications   How often do you check your blood sugar? 0 times/day (not testing)   Provided One Touch Verio Reflect and instructed on use. BG upon return demonstration was 100 mg/dL at 12/18/20 am - 2 hrs pp.   Have you had a dilated eye exam in the past 12 months? Yes    Have you had a dental exam in the past 12 months? Yes    Are you checking your feet? No      Dietary Intake   Breakfast eggs, carrots, zucchini, oats (like a quiche) with milk and blueberries; eggs and 6:43 bacon    Snack (morning) 2 boiled eggs, Kind bar, breakfast bar, fruit (strawberries, watermelon, blueberries)    Lunch left overs    Dinner chicken, stea, [place]: sweet potatoes, beans, peas, broccoli, green beans, cauliflower rice, greens    Snack (evening) sometimes snack at bedtime    Beverage(s) water, occasional coffee      Exercise   Exercise Type Light (walking / raking leaves)    How many days per week to you exercise? 3  How many minutes per day do you exercise? 30    Total minutes per week of exercise 90      Patient Education   Previous Diabetes Education No    Disease state  Definition of diabetes, type 1 and 2, and the diagnosis of diabetes;Factors that contribute to the development of diabetes    Nutrition management  Role of diet in the treatment of diabetes and the relationship between the three main macronutrients and blood glucose level;Food label reading, portion sizes and measuring food.;Carbohydrate counting;Reviewed blood glucose goals for  pre and post meals and how to evaluate the patients' food intake on their blood glucose level.    Physical activity and exercise  Role of exercise on diabetes management, blood pressure control and cardiac health.    Medications Other (comment)   Limited use of oral medications during pregnancy and potential for insulin.   Monitoring Taught/evaluated SMBG meter.;Purpose and frequency of SMBG.;Taught/discussed recording of test results and interpretation of SMBG.;Ketone testing, when, how.    Chronic complications Relationship between chronic complications and blood glucose control    Psychosocial adjustment Identified and addressed patients feelings and concerns about diabetes    Preconception care Pregnancy and GDM  Role of pre-pregnancy blood glucose control on the development of the fetus;Reviewed with patient blood glucose goals with pregnancy;Role of family planning for patients with diabetes      Individualized Goals (developed by patient)   Reducing Risk Other (comment)   improve blood sugars, prevent diabetes complications     Outcomes   Expected Outcomes Demonstrated interest in learning. Expect positive outcomes    Future DMSE 2 wks           Individualized Plan for Diabetes Self-Management Training:   Learning Objective:  Patient will have a greater understanding of diabetes self-management. Patient education plan is to attend individual and/or group sessions per assessed needs and concerns.   Plan:   Patient Instructions  Read booklet on Gestational Diabetes Follow Gestational Meal Planning Guidelines Limit fruit at breakfast if blood sugars elevated Allow 2-3 hours between meals and snacks Complete a 3 Day Food Record and bring to next appointment Check blood sugars 4 x day - before breakfast and 2 hrs after every meal and record  Bring blood sugar log to all appointments Call MD for prescription for meter strips and lancets Strips   One Touch Verio Lancets   One Touch  Delica Plus Purchase urine ketone strips if instructed by MD and check urine ketones every am:  If + increase bedtime snack to 1 protein and 2 carbohydrate servings Walk 20-30 minutes at least 5 x week if permitted by MD  Expected Outcomes:  Demonstrated interest in learning. Expect positive outcomes  Education material provided:  Gestational Booklet Gestational Meal Planning Guidelines Simple Meal Plan Viewed Gestational Diabetes Video Meter = One Touch Verio Reflect 3 Day Food Record Goals for a Healthy Pregnancy  If problems or questions, patient to contact team via:  Sharion Settler, RN, CCM, CDCES [phone]  Future DSME appointment: 2 wks  Oct 31, 2020 with the dietitian

## 2020-10-18 NOTE — Patient Instructions (Signed)
Read booklet on Gestational Diabetes Follow Gestational Meal Planning Guidelines Limit fruit at breakfast if blood sugars elevated Allow 2-3 hours between meals and snacks Complete a 3 Day Food Record and bring to next appointment Check blood sugars 4 x day - before breakfast and 2 hrs after every meal and record  Bring blood sugar log to all appointments Call MD for prescription for meter strips and lancets Strips   One Touch Verio Lancets   One Touch Delica Plus Purchase urine ketone strips if instructed by MD and check urine ketones every am:  If + increase bedtime snack to 1 protein and 2 carbohydrate servings Walk 20-30 minutes at least 5 x week if permitted by MD

## 2020-10-19 ENCOUNTER — Other Ambulatory Visit: Payer: Self-pay | Admitting: Obstetrics and Gynecology

## 2020-10-19 MED ORDER — GLUCOSE BLOOD VI STRP: ORAL_STRIP | 12 refills | Status: DC

## 2020-10-19 MED ORDER — ONETOUCH ULTRASOFT LANCETS MISC: 12 refills | Status: DC

## 2020-10-19 NOTE — Telephone Encounter (Signed)
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  Her samples to ck her blood sugar will run out tomorrow at lunch.  His number is [phone]

## 2020-10-20 ENCOUNTER — Other Ambulatory Visit: Payer: Self-pay | Admitting: Obstetrics & Gynecology

## 2020-10-20 MED ORDER — ONETOUCH DELICA LANCETS 30G MISC: 4 refills | Status: DC

## 2020-10-20 MED ORDER — GLUCOSE BLOOD VI STRP: ORAL_STRIP | 12 refills | Status: DC

## 2020-10-24 ENCOUNTER — Ambulatory Visit (INDEPENDENT_AMBULATORY_CARE_PROVIDER_SITE_OTHER): Payer: BC Managed Care – PPO | Admitting: Obstetrics and Gynecology

## 2020-10-24 ENCOUNTER — Other Ambulatory Visit: Payer: Self-pay

## 2020-10-24 DIAGNOSIS — O0990 Supervision of high risk pregnancy, unspecified, unspecified trimester: Secondary | ICD-10-CM

## 2020-10-24 DIAGNOSIS — O24410 Gestational diabetes mellitus in pregnancy, diet controlled: Secondary | ICD-10-CM

## 2020-10-24 LAB — POCT URINALYSIS DIPSTICK OB
Glucose, UA: NEGATIVE
POC,PROTEIN,UA: NEGATIVE

## 2020-10-24 NOTE — Progress Notes (Signed)
Routine Prenatal Care Visit  Subjective  Haley Bell is a 30 y.o. G1P0000 at [redacted]w[redacted]d being seen today for ongoing prenatal care.  She is currently monitored for the following issues for this high-risk pregnancy and has Preventative health care; GAD (generalized anxiety disorder); Insomnia; Abnormal thyroid blood test; Fatigue; Encounter for birth control pills maintenance; Supervision of high risk pregnancy, antepartum; Elevated glucose tolerance test; and Diet controlled gestational diabetes mellitus (GDM) in third trimester on their problem list.  ----------------------------------------------------------------------------------- Patient reports no complaints.   Contractions: Not present. Vag. Bleeding: None.  Movement: Present. Denies leaking of fluid.  ----------------------------------------------------------------------------------- The following portions of the patient's history were reviewed and updated as appropriate: allergies, current medications, past family history, past medical history, past social history, past surgical history and problem list. Problem list updated.   Objective  Blood pressure 110/60, weight 161 lb (73 kg), last menstrual period 03/14/2020. Pregravid weight 126 lb (57.2 kg) Total Weight Gain 35 lb (15.9 kg) Urinalysis:      Fetal Status: Fetal Heart Rate (bpm): 150 Fundal Height: 31 cm Movement: Present  Presentation: Vertex  General:  Alert, oriented and cooperative. Patient is in no acute distress.  Skin: Skin is warm and dry. No rash noted.   Cardiovascular: Normal heart rate noted  Respiratory: Normal respiratory effort, no problems with respiration noted  Abdomen: Soft, gravid, appropriate for gestational age. Pain/Pressure: Present     Pelvic:  Cervical exam deferred        Extremities: Normal range of motion.     ental Status: Normal mood and affect. Normal behavior. Normal judgment and thought content.   Date Fasting Breakfast Lunch Dinner   5/3  100 94 99  5/4 94 152 105 152  5/5 83 95 88 110  5/6 84 97 78 81  5/7 100 114 83 121  5/8 90 120 95 94  5/9 87 124 93     Assessment   29 y.o. G1P0000 at [redacted]w[redacted]d by  12/15/2020, by Ultrasound presenting for routine prenatal visit  Plan   pregnancy1  Problems (from 03/09/20 to present)    Problem Noted Resolved   Elevated glucose tolerance test 10/05/2020 by Mirna Mires, CNM No   Overview Signed 10/05/2020  5:40 PM by Mirna Mires, CNM    4/20 1hr GTT result 163. 3 hr test ordered and patient notified via My Chart as her VM was full.      Supervision of low-risk pregnancy, first trimester 04/22/2020 by Nadara Mustard, MD No   Overview Addendum 10/05/2020  5:39 PM by Mirna Mires, CNM    Clinic [facility] Prenatal Labs  Dating [place] 6 weeks Blood type: O pos  Genetic Screen CF neg, Fragile-X neg, SMA neg NIPS:Normal XX Antibody: negative  Anatomic [place] [facility] nml w recheck Rubella: Immune Varicella: Immune  GTT   Third trimester: 163. 3hr ordered 4/20 RPR: NR  Rhogam N/A HBsAg: negative  TDaP vaccine           Flu Shot: HIV: negative  Baby Food           Breast                     GBS:   Contraception            POP Pap:04/2020  CBB  no   CS/VBAC n/a   Support Person Husband           Previous Version  Gestational age appropriate obstetric precautions including but not limited to vaginal bleeding, contractions, leaking of fluid and fetal movement were reviewed in detail with the patient.    - GDM well controlled on diet - review BG log one week mychart and follow up 2 week in clinic  Return in about 2 weeks (around 11/07/2020) for ROB (MD).  Vena Austria, MD, [facility], [facility] 10/24/2020, 5:05 PM

## 2020-10-31 ENCOUNTER — Encounter: Payer: Self-pay | Admitting: Dietician

## 2020-10-31 ENCOUNTER — Encounter: Payer: BC Managed Care – PPO | Admitting: Dietician

## 2020-10-31 ENCOUNTER — Other Ambulatory Visit: Payer: Self-pay

## 2020-10-31 VITALS — BP 110/70 | Ht 67.0 in | Wt 165.5 lb

## 2020-10-31 DIAGNOSIS — O24410 Gestational diabetes mellitus in pregnancy, diet controlled: Secondary | ICD-10-CM

## 2020-10-31 NOTE — Progress Notes (Signed)
.   Patient's BG record indicates fasting BGs ranging 83-100, and post-meal BGs ranging 78-153 (6 of 42 readings above 130) . Patient's food diary indicates most meals and snacks low in carb intake, healthy food choices overall. Patient reports she was craving sweets but has now stopped eating sweets. Some higher BG readings have been after eating a sweet.  . Provided basic balanced meal plan, and wrote individualized menus based on patient's food preferences. . Discussed including at least a small amount of carbohydrate with meals to promote consistency with blood sugars.  . Instructed patient on food safety, including avoidance of Listeriosis, and limiting mercury from fish. . Discussed importance of maintaining healthy lifestyle habits to reduce risk of Type 2 DM as well as Gestational DM with any future pregnancies. . Advised patient to use any remaining testing supplies to test some BGs after delivery, and to have BG tested ideally annually, as well as prior to attempting future pregnancies.

## 2020-10-31 NOTE — Patient Instructions (Signed)
   Try including 15-30grams (1-2 servings) of carb foods with meals and snacks to naturally adjust and keep blood sugars steady throughout the day.  Low blood sugar is any level <70. Treat with 4oz juice/ soda/ sweet tea which should help within 10-15 minutes. Follow with a meal or snack.

## 2020-11-07 ENCOUNTER — Other Ambulatory Visit: Payer: Self-pay

## 2020-11-07 ENCOUNTER — Ambulatory Visit (INDEPENDENT_AMBULATORY_CARE_PROVIDER_SITE_OTHER): Payer: BC Managed Care – PPO | Admitting: Obstetrics & Gynecology

## 2020-11-07 ENCOUNTER — Encounter: Payer: Self-pay | Admitting: Obstetrics & Gynecology

## 2020-11-07 VITALS — BP 100/70 | Wt 166.0 lb

## 2020-11-07 DIAGNOSIS — Z3A34 34 weeks gestation of pregnancy: Secondary | ICD-10-CM

## 2020-11-07 DIAGNOSIS — O24410 Gestational diabetes mellitus in pregnancy, diet controlled: Secondary | ICD-10-CM

## 2020-11-07 DIAGNOSIS — O0993 Supervision of high risk pregnancy, unspecified, third trimester: Secondary | ICD-10-CM

## 2020-11-07 MED ORDER — ONETOUCH DELICA LANCETS 30G MISC: 4 refills | Status: DC

## 2020-11-07 NOTE — Progress Notes (Signed)
  Subjective  Fetal Movement? yes Contractions? no Leaking Fluid? no Vaginal Bleeding? no  Objective  BP 100/70   Wt 166 lb (75.3 kg)   LMP 03/14/2020   BMI 26.00 kg/m  General: NAD Pumonary: no increased work of breathing Abdomen: gravid, non-tender Extremities: no edema Psychiatric: mood appropriate, affect full  Assessment  30 y.o. G1P0000 at [redacted]w[redacted]d by  12/15/2020, by Ultrasound presenting for routine prenatal visit  Plan   Problem List Items Addressed This Visit      Endocrine   Diet controlled gestational diabetes mellitus (GDM) in third trimester     Other   Supervision of high risk pregnancy, antepartum - Primary    Other Visit Diagnoses    [redacted] weeks gestation of pregnancy          pregnancy1  Problems (from 03/09/20 to present)    Problem Noted Resolved   Elevated glucose tolerance test 10/05/2020 by Mirna Mires, CNM No   Overview Signed 10/05/2020  5:40 PM by Mirna Mires, CNM    4/20 1hr GTT result 163. 3 hr test ordered and patient notified via My Chart as her VM was full.      Supervision of high risk pregnancy, antepartum 04/22/2020 by Nadara Mustard, MD No   Overview Addendum 10/05/2020  5:39 PM by Mirna Mires, CNM    Clinic [facility] Prenatal Labs  Dating [place] 6 weeks Blood type: O pos  Genetic Screen CF neg, Fragile-X neg, SMA neg NIPS:Normal XX Antibody: negative  Anatomic [place] [facility] nml w recheck Rubella: Immune Varicella: Immune  GTT   Third trimester: 163. 3hr ordered 4/20 RPR: NR  Rhogam N/A HBsAg: negative  TDaP vaccine           Flu Shot: HIV: negative  Baby Food           Breast                     GBS:   Contraception            POP Pap:04/2020  CBB  no   CS/VBAC n/a   Support Person Husband        PNV,FMC  GBS nv  Discussed L&D  The following were addressed during this visit:  Breastfeeding Education - Early initiation of breastfeeding  - The importance of exclusive breastfeeding  - Risks of giving your baby anything  other than breast milk if you are breastfeeding  - Nonpharmacological pain relief methods for labor  - The importance of early skin-to-skin contact  - Rooming-in on a 24-hour basis  - Feeding on demand or baby-led feeding  - Frequent feeding to help assure optimal milk production  - Effective positioning and attachment  - Exclusive breastfeeding for the first 6 months  - Individualized Education     Annamarie Major, MD, [facility], [facility] 11/07/2020  4:50 PM

## 2020-11-16 ENCOUNTER — Other Ambulatory Visit: Payer: Self-pay | Admitting: Obstetrics & Gynecology

## 2020-11-16 DIAGNOSIS — O24410 Gestational diabetes mellitus in pregnancy, diet controlled: Secondary | ICD-10-CM

## 2020-11-17 NOTE — Telephone Encounter (Signed)
Yes that's fine to write her

## 2020-11-21 ENCOUNTER — Ambulatory Visit (INDEPENDENT_AMBULATORY_CARE_PROVIDER_SITE_OTHER): Payer: BC Managed Care – PPO | Admitting: Obstetrics & Gynecology

## 2020-11-21 ENCOUNTER — Other Ambulatory Visit: Payer: Self-pay

## 2020-11-21 ENCOUNTER — Encounter: Payer: Self-pay | Admitting: Obstetrics & Gynecology

## 2020-11-21 ENCOUNTER — Other Ambulatory Visit: Payer: Self-pay | Admitting: Obstetrics & Gynecology

## 2020-11-21 VITALS — BP 120/80 | Wt 172.0 lb

## 2020-11-21 DIAGNOSIS — O0993 Supervision of high risk pregnancy, unspecified, third trimester: Secondary | ICD-10-CM

## 2020-11-21 DIAGNOSIS — O24410 Gestational diabetes mellitus in pregnancy, diet controlled: Secondary | ICD-10-CM

## 2020-11-21 DIAGNOSIS — Z3685 Encounter for antenatal screening for Streptococcus B: Secondary | ICD-10-CM

## 2020-11-21 DIAGNOSIS — Z3A36 36 weeks gestation of pregnancy: Secondary | ICD-10-CM

## 2020-11-21 LAB — POCT URINALYSIS DIPSTICK OB
Glucose, UA: NEGATIVE
POC,PROTEIN,UA: NEGATIVE

## 2020-11-21 MED ORDER — GLUCOSE BLOOD VI STRP: ORAL_STRIP | 12 refills | Status: DC

## 2020-11-21 NOTE — Patient Instructions (Signed)
First Stage of Labor Labor is your body's natural process of moving your baby and other structures, including the placenta and umbilical cord, out of your uterus. There are three stages of labor. How long each stage lasts is different for every woman. But certain events happen during each stage that are the same for everyone.  The first stage starts when true labor begins. This stage ends when your cervix, which is the opening from your uterus into your vagina, is completely open (dilated).  The second stage begins when your cervix is fully dilated and you start pushing. This stage ends when your baby is born.  The third stage is the delivery of the organ that nourished your baby during pregnancy (placenta). First stage of labor As your due date gets closer, you may start to notice certain physical changes that mean labor is going to start soon. You may feel that your baby has dropped lower into your pelvis. You may experience irregular, often painless, contractions that go away when you walk around or lie down (Braxton Hicks contractions). This is also called false labor. The first stage of labor begins when you start having contractions that come at regular (evenly spaced) intervals and your cervix starts to get thinner and wider in preparation for your baby to pass through. Birth care providers measure the dilation of your cervix in centimeters (cm). One centimeter is a little less than one-half of an inch. The first stage ends when your cervix is dilated to 10 cm. The first stage of labor is divided into three phases:  Early phase.  Active phase.  Transitional phase. The length of the first stage of labor varies. It may be longer if this is your first pregnancy. You may spend most of this stage at home trying to relax and stay comfortable. How does this affect me? During the first stage of labor, you will move through three phases. What happens in the early phase?  You will start to have  regular contractions that last 30-60 seconds. Contractions may come every 5-20 minutes. Keep track of your contractions and call your birth care provider.  Your water may break during this phase.  You may notice a clear or slightly bloody discharge of mucus (mucus plug) from your vagina.  Your cervix will dilate to 3-6 cm. What happens in the active phase? The active phase usually lasts 3-5 hours. You may go to the hospital or birth center around this time. During the active phase:  Your contractions will become stronger, longer, and more uncomfortable.  Your contractions may last 45-90 seconds and come every 3-5 minutes.  You may feel lower back pain.  Your birth care providers may examine your cervix and feel your belly to find the position of your baby.  You may have a monitor strapped to your belly to measure your contractions and your baby's heart rate.  You may start using your pain management options.  Your cervix may be dilated to 6 cm and may start to dilate more quickly. What happens in the transitional phase? The transitional phase typically lasts from 30 minutes to 2 hours. At the end of this phase, your cervix will be fully dilated to 10 cm. During the transitional phase:  Contractions will get stronger and longer.  Contractions may last 60-90 seconds and come less than 2 minutes apart.  You may feel hot flashes, chills, or nausea. How does this affect my baby? During the first stage of labor, your baby will   gradually move down into your birth canal. Follow these instructions at home and in the hospital or birth center:  When labor first begins, try to stay calm. You are still in the early phase. If it is night, try to get some sleep. If it is day, try to relax and save your energy. You may want to make some calls and get ready to go to the hospital or birth center.  When you are in the early phase, try these methods to help ease discomfort: ? Deep breathing and  muscle relaxation. ? Taking a walk. ? Taking a warm bath or shower.  Drink some fluids and have a light snack if you feel like it.  Keep track of your contractions.  Based on the plan you created with your birth care provider, call when your contractions indicate it is time.  If your water breaks, note the time, color, and odor of the fluid.  When you are in the active phase, do your breathing exercises and rely on your support people and your team of birth care providers.   Contact a health care provider if:  Your contractions are strong and regular.  You have lower back pain or cramping.  Your water breaks.  You lose your mucus plug. Get help right away if you:  Have a severe headache that does not go away.  Have changes in your vision.  Have severe pain in your upper belly.  Do not feel the baby move.  Have bright red bleeding. Summary  The first stage of labor starts when true labor begins, and it ends when your cervix is dilated to 10 cm.  The first stage of labor has three phases: early, active, and transitional.  Your baby moves into the birth canal during the first stage of labor.  You may have contractions that become stronger and longer. You may also lose your mucus plug and have your water break.  Call your birth care provider when your contractions are frequent and strong enough to go to the hospital or birth center. This information is not intended to replace advice given to you by your health care provider. Make sure you discuss any questions you have with your health care provider. Document Revised: 09/25/2018 Document Reviewed: 08/18/2017 Elsevier Patient Education  2021 Elsevier Inc.  

## 2020-11-21 NOTE — Addendum Note (Signed)
Addended by: Cornelius Moras D on: 11/21/2020 03:11 PM   Modules accepted: Orders

## 2020-11-21 NOTE — Progress Notes (Signed)
  Subjective  Fetal Movement? yes Contractions? no Leaking Fluid? no Vaginal Bleeding? no  Objective  BP 120/80   Wt 172 lb (78 kg)   LMP 03/14/2020   BMI 26.94 kg/m  General: NAD Pumonary: no increased work of breathing Abdomen: gravid, non-tender Extremities: no edema Psychiatric: mood appropriate, affect full  Assessment  29 y.o. G1P0000 at [redacted]w[redacted]d by  12/15/2020, by Ultrasound presenting for routine prenatal visit  Plan   Problem List Items Addressed This Visit      Endocrine   Diet controlled gestational diabetes mellitus (GDM) in third trimester    Other Visit Diagnoses    Supervision of high risk pregnancy in third trimester    -  Primary   [redacted] weeks gestation of pregnancy       Encounter for antenatal screening for Streptococcus B       Relevant Orders   Culture, beta strep (group b only)    PNV, FMC, Labor precautions, GBS  pregnancy1  Problems (from 03/09/20 to present)    Problem Noted Resolved   Elevated glucose tolerance test 10/05/2020 by Mirna Mires, CNM No   Overview Signed 10/05/2020  5:40 PM by Mirna Mires, CNM    4/20 1hr GTT result 163. 3 hr test ordered and patient notified via My Chart as her VM was full.      Supervision of high risk pregnancy, antepartum 04/22/2020 by Nadara Mustard, MD No   Overview Addendum 10/05/2020  5:39 PM by Mirna Mires, CNM    Clinic [facility] Prenatal Labs  Dating [place] 6 weeks Blood type: O pos  Genetic Screen CF neg, Fragile-X neg, SMA neg NIPS:Normal XX Antibody: negative  Anatomic [place] [facility] nml w recheck Rubella: Immune Varicella: Immune  GTT   Third trimester: 163. 3hr ordered 4/20 RPR: NR  Rhogam N/A HBsAg: negative  TDaP vaccine           Flu Shot: HIV: negative  Baby Food           Breast                     GBS:   Contraception            POP Pap:04/2020  CBB  no   CS/VBAC n/a   Support Person Husband           Previous Version       Annamarie Major, MD, [facility], [facility] 11/21/2020  3:05 PM

## 2020-11-25 ENCOUNTER — Other Ambulatory Visit: Payer: Self-pay | Admitting: Obstetrics and Gynecology

## 2020-11-25 ENCOUNTER — Ambulatory Visit
Admission: RE | Admit: 2020-11-25 | Discharge: 2020-11-25 | Disposition: A | Discharge status: Home / Self Care | Payer: BC Managed Care – PPO | Source: Ambulatory Visit | Attending: Obstetrics and Gynecology | Admitting: Obstetrics and Gynecology

## 2020-11-25 ENCOUNTER — Other Ambulatory Visit: Payer: Self-pay

## 2020-11-25 DIAGNOSIS — M79662 Pain in left lower leg: Secondary | ICD-10-CM

## 2020-11-25 DIAGNOSIS — R2242 Localized swelling, mass and lump, left lower limb: Secondary | ICD-10-CM | POA: Insufficient documentation

## 2020-11-25 LAB — CULTURE, BETA STREP (GROUP B ONLY): Strep Gp B Culture: NEGATIVE

## 2020-11-25 NOTE — Telephone Encounter (Signed)
Pt called and informed [facility].

## 2020-11-28 ENCOUNTER — Ambulatory Visit (INDEPENDENT_AMBULATORY_CARE_PROVIDER_SITE_OTHER): Payer: BC Managed Care – PPO | Admitting: Obstetrics and Gynecology

## 2020-11-28 ENCOUNTER — Other Ambulatory Visit: Payer: Self-pay

## 2020-11-28 VITALS — BP 112/72 | Wt 173.0 lb

## 2020-11-28 DIAGNOSIS — Z3A37 37 weeks gestation of pregnancy: Secondary | ICD-10-CM

## 2020-11-28 DIAGNOSIS — O24410 Gestational diabetes mellitus in pregnancy, diet controlled: Secondary | ICD-10-CM

## 2020-11-28 DIAGNOSIS — O0993 Supervision of high risk pregnancy, unspecified, third trimester: Secondary | ICD-10-CM

## 2020-11-28 DIAGNOSIS — O0990 Supervision of high risk pregnancy, unspecified, unspecified trimester: Secondary | ICD-10-CM

## 2020-11-28 LAB — POCT URINALYSIS DIPSTICK OB
Glucose, UA: NEGATIVE
POC,PROTEIN,UA: NEGATIVE

## 2020-11-28 NOTE — Progress Notes (Signed)
Routine Prenatal Care Visit  Subjective  Haley Bell is a 30 y.o. G1P0000 at [redacted]w[redacted]d being seen today for ongoing prenatal care.  She is currently monitored for the following issues for this high-risk pregnancy and has Preventative health care; GAD (generalized anxiety disorder); Insomnia; Abnormal thyroid blood test; Fatigue; Encounter for birth control pills maintenance; Supervision of high risk pregnancy, antepartum; Elevated glucose tolerance test; and Diet controlled gestational diabetes mellitus (GDM) in third trimester on their problem list.  ----------------------------------------------------------------------------------- Patient reports  pressure .   Contractions: Not present. Vag. Bleeding: None.  Movement: Present. Denies leaking of fluid.  ----------------------------------------------------------------------------------- The following portions of the patient's history were reviewed and updated as appropriate: allergies, current medications, past family history, past medical history, past social history, past surgical history and problem list. Problem list updated.   Objective  Blood pressure 112/72, weight 173 lb (78.5 kg), last menstrual period 03/14/2020. Pregravid weight 126 lb (57.2 kg) Total Weight Gain 47 lb (21.3 kg) Urinalysis:      Fetal Status: Fetal Heart Rate (bpm): 140 Fundal Height: 36 cm Movement: Present  Presentation: Vertex  General:  Alert, oriented and cooperative. Patient is in no acute distress.  Skin: Skin is warm and dry. No rash noted.   Cardiovascular: Normal heart rate noted  Respiratory: Normal respiratory effort, no problems with respiration noted  Abdomen: Soft, gravid, appropriate for gestational age. Pain/Pressure: Present     Pelvic:  Cervical exam performed Dilation: 1 Effacement (%): 70 Station: -3  Extremities: Normal range of motion.     ental Status: Normal mood and affect. Normal behavior. Normal judgment and thought content.      Assessment   30 y.o. G1P0000 at [redacted]w[redacted]d by  12/15/2020, by Ultrasound presenting for routine prenatal visit  Plan   [email or web address]  Problems (from 03/09/20 to present)     Problem Noted Resolved   Elevated glucose tolerance test 10/05/2020 by Mirna Mires, CNM No   Overview Signed 10/05/2020  5:40 PM by Mirna Mires, CNM    4/20 1hr GTT result 163. 3 hr test ordered and patient notified via My Chart as her VM was full.       Supervision of high risk pregnancy, antepartum 04/22/2020 by Nadara Mustard, MD No   Overview Addendum 10/05/2020  5:39 PM by Mirna Mires, CNM    [facility] Prenatal Labs  Dating [place] 6 weeks Blood type: O pos  Genetic Screen CF neg, Fragile-X neg, SMA neg NIPS:Normal XX Antibody: negative  Anatomic [place] [facility] nml w recheck Rubella: Immune Varicella: Immune  GTT   Third trimester: 163. 3hr ordered 4/20 RPR: NR  Rhogam N/A HBsAg: negative  TDaP vaccine           Flu Shot: HIV: negative  Baby Food           Breast                     GBS: negative  Contraception            POP Pap:04/2020  CBB  no   CS/VBAC n/a   Support Person Husband                 Gestational age appropriate obstetric precautions including but not limited to vaginal bleeding, contractions, leaking of fluid and fetal movement were reviewed in detail with the patient.    1) BG - remain at goal  2) Discussed IOL 39 weeks, 40  weeks, vs 41 weeks if not in labor and pros and cons.  Return in about 1 week (around 12/05/2020) for ROB.  Vena Austria, MD, [facility], [facility] 11/28/2020, 5:23 PM

## 2020-11-28 NOTE — Progress Notes (Signed)
ROB - request cervical check, lots of pelvic pressure. RM 5

## 2020-12-05 ENCOUNTER — Ambulatory Visit (INDEPENDENT_AMBULATORY_CARE_PROVIDER_SITE_OTHER): Payer: BC Managed Care – PPO | Admitting: Obstetrics and Gynecology

## 2020-12-05 ENCOUNTER — Other Ambulatory Visit: Payer: Self-pay

## 2020-12-05 VITALS — BP 126/82 | Wt 173.0 lb

## 2020-12-05 DIAGNOSIS — Z3A38 38 weeks gestation of pregnancy: Secondary | ICD-10-CM

## 2020-12-05 DIAGNOSIS — O24410 Gestational diabetes mellitus in pregnancy, diet controlled: Secondary | ICD-10-CM

## 2020-12-05 DIAGNOSIS — O0993 Supervision of high risk pregnancy, unspecified, third trimester: Secondary | ICD-10-CM

## 2020-12-05 LAB — POCT URINALYSIS DIPSTICK OB
Glucose, UA: NEGATIVE
POC,PROTEIN,UA: NEGATIVE

## 2020-12-05 NOTE — Progress Notes (Signed)
Routine Prenatal Care Visit  Subjective  Haley Bell is a 30 y.o. G1P0000 at [redacted]w[redacted]d being seen today for ongoing prenatal care.  She is currently monitored for the following issues for this high-risk pregnancy and has GAD (generalized anxiety disorder); Insomnia; Abnormal thyroid blood test; Fatigue; Supervision of high risk pregnancy, antepartum; Elevated glucose tolerance test; and Diet controlled gestational diabetes mellitus (GDM) in third trimester on their problem list.  ----------------------------------------------------------------------------------- Patient reports no complaints.    .  .   . Denies leaking of fluid.  ----------------------------------------------------------------------------------- The following portions of the patient's history were reviewed and updated as appropriate: allergies, current medications, past family history, past medical history, past social history, past surgical history and problem list. Problem list updated.   Objective  Blood pressure 126/82, weight 173 lb (78.5 kg), last menstrual period 03/14/2020. Pregravid weight 126 lb (57.2 kg) Total Weight Gain 47 lb (21.3 kg) Urinalysis:      Fetal Status:           General:  Alert, oriented and cooperative. Patient is in no acute distress.  Skin: Skin is warm and dry. No rash noted.   Cardiovascular: Normal heart rate noted  Respiratory: Normal respiratory effort, no problems with respiration noted  Abdomen: Soft, gravid, appropriate for gestational age.       Pelvic:  Cervical exam performed        Extremities: Normal range of motion.     ental Status: Normal mood and affect. Normal behavior. Normal judgment and thought content.     Assessment   30 y.o. G1P0000 at [redacted]w[redacted]d by  12/15/2020, by Ultrasound presenting for routine prenatal visit  Plan   pregnancy1  Problems (from 03/09/20 to present)     Problem Noted Resolved   Elevated glucose tolerance test 10/05/2020 by Mirna Mires, CNM No   Overview Signed 10/05/2020  5:40 PM by Mirna Mires, CNM    4/20 1hr GTT result 163. 3 hr test ordered and patient notified via My Chart as her VM was full.       Supervision of high risk pregnancy, antepartum 04/22/2020 by Nadara Mustard, MD No   Overview Addendum 11/28/2020  5:24 PM by Vena Austria, MD    [facility] Prenatal [facility]  Dating [place] 6 weeks Blood type: O pos  Genetic Screen CF neg, Fragile-X neg, SMA neg NIPS:Normal XX Antibody: negative  Anatomic [place] [facility] nml w recheck Rubella: Immune Varicella: Immune  GTT   Third trimester: 163. 3hr ordered 4/20 RPR: NR  Rhogam N/A HBsAg: negative  TDaP vaccine           Flu Shot: HIV: negative  Baby Food           Breast                     GBS: negative  Contraception            POP Pap:04/2020  CBB  no   CS/VBAC n/a   Support Person Husband                 Gestational age appropriate obstetric precautions including but not limited to vaginal bleeding, contractions, leaking of fluid and fetal movement were reviewed in detail with the patient.    IOL 6/28, GDM diet controlled.  BG still running within goal range  Return in about 1 week (around 12/12/2020) for ROB.  Vena Austria, MD, [facility], [facility] 12/05/2020, 4:47 PM

## 2020-12-05 NOTE — Progress Notes (Signed)
ROB - request cervical check,  no concerns. RM 5

## 2020-12-08 ENCOUNTER — Ambulatory Visit (INDEPENDENT_AMBULATORY_CARE_PROVIDER_SITE_OTHER): Payer: BC Managed Care – PPO | Admitting: Advanced Practice Midwife

## 2020-12-08 ENCOUNTER — Other Ambulatory Visit: Payer: Self-pay

## 2020-12-08 ENCOUNTER — Encounter: Payer: Self-pay | Admitting: Advanced Practice Midwife

## 2020-12-08 VITALS — BP 100/60 | Wt 172.0 lb

## 2020-12-08 DIAGNOSIS — Z3A39 39 weeks gestation of pregnancy: Secondary | ICD-10-CM

## 2020-12-08 DIAGNOSIS — O0993 Supervision of high risk pregnancy, unspecified, third trimester: Secondary | ICD-10-CM

## 2020-12-08 DIAGNOSIS — O24410 Gestational diabetes mellitus in pregnancy, diet controlled: Secondary | ICD-10-CM

## 2020-12-08 LAB — POCT URINALYSIS DIPSTICK OB
Glucose, UA: NEGATIVE
POC,PROTEIN,UA: NEGATIVE

## 2020-12-08 NOTE — Progress Notes (Signed)
Routine Prenatal Care Visit  Subjective  Haley Bell is a 30 y.o. G1P0000 at [redacted]w[redacted]d being seen today for ongoing prenatal care.  She is currently monitored for the following issues for this high-risk pregnancy and has GAD (generalized anxiety disorder); Insomnia; Abnormal thyroid blood test; Fatigue; Supervision of high risk pregnancy, antepartum; Elevated glucose tolerance test; and Diet controlled gestational diabetes mellitus (GDM) in third trimester on their problem list.  ----------------------------------------------------------------------------------- Patient reports no complaints.   Contractions: Not present. Vag. Bleeding: None.  Movement: Present. Leaking Fluid denies.  ----------------------------------------------------------------------------------- The following portions of the patient's history were reviewed and updated as appropriate: allergies, current medications, past family history, past medical history, past social history, past surgical history and problem list. Problem list updated.  Objective  Blood pressure 100/60, weight 172 lb (78 kg), last menstrual period 03/14/2020. Pregravid weight 126 lb (57.2 kg) Total Weight Gain 46 lb (20.9 kg) Urinalysis: Urine Protein Negative  Urine Glucose Negative  Fetal Status: Fetal Heart Rate (bpm): 146   Movement: Present  Presentation: Vertex   BS log: fasting normal, 1 elevated after meal of 130s and otherwise normal  General:  Alert, oriented and cooperative. Patient is in no acute distress.  Skin: Skin is warm and dry. No rash noted.   Cardiovascular: Normal heart rate noted  Respiratory: Normal respiratory effort, no problems with respiration noted  Abdomen: Soft, gravid, appropriate for gestational age. Pain/Pressure: Absent     Pelvic:  Cervical exam performed Dilation: 2.5 Effacement (%): 80 Station: -2  Extremities: Normal range of motion.  Edema: None  Mental Status: Normal mood and affect. Normal behavior.  Normal judgment and thought content.   Assessment   30 y.o. G1P0000 at [redacted]w[redacted]d by  12/15/2020, by Ultrasound presenting for routine prenatal visit  Plan   pregnancy1  Problems (from 03/09/20 to present)    Problem Noted Resolved   Elevated glucose tolerance test 10/05/2020 by Mirna Mires, CNM No   Overview Signed 10/05/2020  5:40 PM by Mirna Mires, CNM    4/20 1hr GTT result 163. 3 hr test ordered and patient notified via My Chart as her VM was full.       Supervision of high risk pregnancy, antepartum 04/22/2020 by Nadara Mustard, MD No   Overview Addendum 11/28/2020  5:24 PM by Vena Austria, MD    [facility] Prenatal [facility]  Dating [place] 6 weeks Blood type: O pos  Genetic Screen CF neg, Fragile-X neg, SMA neg NIPS:Normal XX Antibody: negative  Anatomic [place] [facility] nml w recheck Rubella: Immune Varicella: Immune  GTT   Third trimester: 163. 3hr ordered 4/20 RPR: NR  Rhogam N/A HBsAg: negative  TDaP vaccine           Flu Shot: HIV: negative  Baby Food           Breast                     GBS: negative  Contraception            POP Pap:04/2020  CBB  no   CS/VBAC n/a   Support Person Husband                Term labor symptoms and general obstetric precautions including but not limited to vaginal bleeding, contractions, leaking of fluid and fetal movement were reviewed in detail with the patient. Please refer to After Visit Summary for other counseling recommendations.   Return for IOL 6/28.  Tresea Mall, CNM 12/08/2020 5:01 PM

## 2020-12-08 NOTE — Patient Instructions (Signed)
Labor Induction °Labor induction is when steps are taken to cause a pregnant woman to begin the labor process. Most women go into labor on their own between 37 weeks and 42 weeks of pregnancy. When this does not happen, or when there is a medical need for labor to begin, steps may be taken to induce, or bring on, labor. °Labor induction causes a pregnant woman's uterus to contract. It also causes the cervix to soften (ripen), open (dilate), and thin out. Usually, labor is not induced before 39 weeks of pregnancy unless there is a medical reason to do so. °When is labor induction considered? °Labor induction may be right for you if: °Your pregnancy lasts longer than 41 to 42 weeks. °Your placenta is separating from your uterus (placental abruption). °You have a rupture of membranes and your labor does not begin. °You have health problems, like diabetes or high blood pressure (preeclampsia) during your pregnancy. °Your baby has stopped growing or does not have enough amniotic fluid. °Before labor induction begins, your health care provider will consider the following factors: °Your medical condition and the baby's condition. °How many weeks you have been pregnant. °How mature the baby's lungs are. °The condition of your cervix. °The position of the baby. °The size of your birth canal. °Tell a health care provider about: °Any allergies you have. °All medicines you are taking, including vitamins, herbs, eye drops, creams, and over-the-counter medicines. °Any problems you or your family members have had with anesthetic medicines. °Any surgeries you have had. °Any blood disorders you have. °Any medical conditions you have. °What are the risks? °Generally, this is a safe procedure. However, problems may occur, including: °Failed induction. °Changes in fetal heart rate, such as being too high, too low, or irregular (erratic). °Infection in the mother or the baby. °Increased risk of having a cesarean delivery. °Breaking off  (abruption) of the placenta from the uterus. This is rare. °Rupture of the uterus. This is very rare. °Your baby could fail to get enough blood flow or oxygen. This can be life-threatening. °When induction is needed for medical reasons, the benefits generally outweigh the risks. °What happens during the procedure? °During the procedure, your health care provider will use one of these methods to induce labor: °Stripping the membranes. In this method, the amniotic sac tissue is gently separated from the cervix. This causes the following to happen: °Your cervix stretches, which in turn causes the release of prostaglandins. °Prostaglandins induce labor and cause the uterus to contract. °This procedure is often done in an office visit. You will be sent home to wait for contractions to begin. °Prostaglandin medicine. This medicine starts contractions and causes the cervix to dilate and ripen. This can be taken by mouth (orally) or by being inserted into the vagina (suppository). °Inserting a small, thin tube (catheter) with a balloon into the vagina and then expanding the balloon with water to dilate the cervix. °Breaking the water. In this method, a small instrument is used to make a small hole in the amniotic sac. This eventually causes the amniotic sac to break. Contractions should begin within a few hours. °Medicine to trigger or strengthen contractions. This medicine is given through an IV that is inserted into a vein in your arm. °This procedure may vary among health care providers and hospitals. °Where to find more information °[employer]: www.marchofdimes.org °The American College of Obstetricians and Gynecologists: www.acog.org °Summary °Labor induction causes a pregnant woman's uterus to contract. It also causes the cervix   to soften (ripen), open (dilate), and thin out. °Labor is usually not induced before 39 weeks of pregnancy unless there is a medical reason to do so. °When induction is needed for medical  reasons, the benefits generally outweigh the risks. °Talk with your health care provider about which methods of labor induction are right for you. °This information is not intended to replace advice given to you by your health care provider. Make sure you discuss any questions you have with your health care provider. °Document Revised: 03/17/2020 Document Reviewed: 03/17/2020 °Elsevier Patient Education © 2022 [employer] °Pain Relief During Labor and Delivery °Many things can cause pain during labor and delivery, including: °Pressure due to the baby moving through the pelvis. °Stretching of tissues due to the baby moving through the birth canal. °Muscle tension due to anxiety or nervousness. °The uterus tightening (contracting)and relaxing to help move the baby. °How do I get pain relief during labor and delivery? °Discuss your pain relief options with your health care provider during your prenatal visits. Explore the options offered by your hospital or birth center. There are many ways to deal with the pain of labor and delivery. You can try relaxation techniques or doing relaxing activities, taking a warm shower or bath (hydrotherapy), or other methods. There are also many medicines available to help control pain. °Relaxation techniques and activities °Practice relaxation techniques or do relaxing activities, such as: °Focused breathing. °Meditation. °Visualization. °Aroma therapy. °Listening to your favorite music. °Hypnosis. °Hydrotherapy °Take a warm shower or bath. This may: °Provide comfort and relaxation. °Lessen your feeling of pain. °Reduce the amount of pain medicine needed. °Shorten the length of labor. °Other methods °Try doing other things, such as: °Getting a massage or having counterpressure on your back. °Applying warm packs or ice packs. °Changing positions often, moving around, or using a birthing ball. °Medicines °You may be given: °Pain medicine through an IV or an injection into a  muscle. °Pain medicine inserted into your spinal column. °Injections of sterile water just under the skin on your lower back. °Nitrous oxide inhalation therapy, also called laughing gas. °What kinds of medicine are available for pain relief? °There are two kinds of medicines that can be used to relieve pain during labor and delivery: °Analgesics. These medicines decrease pain without causing you to lose feeling or the ability to move your muscles. °Anesthetics. These medicines block feeling in the body and can decrease your ability to move freely. °Both kinds of medicine can cause minor side effects, such as nausea, trouble concentrating, and sleepiness. They can also affect the baby's heart rate before birth and his or her breathing after birth. For this reason, health care providers are careful about when and how much medicine is given. °Which medicines are used to provide pain relief? °Common medicines °The most common medicines used to help manage pain during labor and delivery include: °Opioids. Opioids are medicines that decrease how much pain you feel (perception of pain). These medicines can be given through an IV or may be used with anesthetics to block pain. °Epidural analgesia. °Epidural analgesia is given through a very thin tube that is inserted into the lower back. Medicine is delivered continuously to the area near your spinal column nerves (epidural space). After having this treatment, you may be able to move your legs, but you will not be able to walk. Depending on the amount and type of medicine given, you may lose all feeling in the lower half of your body, or you   may have some sensation, including the urge to push. This treatment can be used to give pain relief for a vaginal birth. °Sometimes, a numbing medicine is injected into the spinal fluid when an epidural catheter is placed. This provides for immediate relief but only lasts for 1-2 hours. Once it wears off, the epidural will provide pain  relief. This is called a combined spinal-epidural (CSE) block. °Intrathecal analgesia (spinal analgesia). Intrathecal analgesia is similar to epidural analgesia, but the medicine is injected into the spinal fluid instead of the epidural space. It is usually only given once. It starts to relieve pain quickly, but the pain relief lasts only 1-2 hours. °Pudendal block. This block is done by injecting numbing medicine through the wall of the vagina and into a nerve in the pelvis. °Other medicines °Other medicines used to help manage pain during labor and delivery include: °Local anesthetics. These are used to numb a small area of the body. They may be used along with another kind of medicine or used to numb the nerves of the vagina, cervix, and perineum during the second stage of labor. °Spinal block (spinal anesthesia). Spinal anesthesia is similar to spinal analgesia, but the medicine that is used contains longer-acting numbing medicines and pain medicines. This type of anesthesia can be used for a cesarean delivery and allows you to stay awake for the birth of your baby. °General anesthetics cause you to lose consciousness so you do not feel pain. They are usually only used for an emergency cesarean delivery. These medicines are given through an IV or a mask or both. °These medicines are used as part of a procedure or for an emergency delivery. °Summary °Women have many options to help them manage the pain associated with labor and delivery. °You can try doing relaxing activities, taking a warm shower or bath, or other methods. °There are also many medicines available to help control pain during labor and delivery. °Talk with your health care provider about what options are available to you. °This information is not intended to replace advice given to you by your health care provider. Make sure you discuss any questions you have with your health care provider. °Document Revised: 04/22/2019 Document Reviewed:  04/22/2019 °Elsevier Patient Education © 2022 Elsevier Inc. ° °

## 2020-12-08 NOTE — Progress Notes (Signed)
ROB- cervix check 

## 2020-12-09 ENCOUNTER — Inpatient Hospital Stay: Payer: BC Managed Care – PPO | Admitting: Anesthesiology

## 2020-12-09 ENCOUNTER — Encounter: Payer: Self-pay | Admitting: Obstetrics and Gynecology

## 2020-12-09 ENCOUNTER — Inpatient Hospital Stay
Admission: EM | Admit: 2020-12-09 | Discharge: 2020-12-10 | DRG: 806 | Disposition: A | Discharge status: Home / Self Care | Payer: BC Managed Care – PPO | Attending: Obstetrics and Gynecology | Admitting: Obstetrics and Gynecology

## 2020-12-09 DIAGNOSIS — O26893 Other specified pregnancy related conditions, third trimester: Secondary | ICD-10-CM | POA: Diagnosis present

## 2020-12-09 DIAGNOSIS — O24420 Gestational diabetes mellitus in childbirth, diet controlled: Secondary | ICD-10-CM | POA: Diagnosis present

## 2020-12-09 DIAGNOSIS — Z3A39 39 weeks gestation of pregnancy: Secondary | ICD-10-CM

## 2020-12-09 DIAGNOSIS — O0990 Supervision of high risk pregnancy, unspecified, unspecified trimester: Secondary | ICD-10-CM

## 2020-12-09 DIAGNOSIS — Z20822 Contact with and (suspected) exposure to covid-19: Secondary | ICD-10-CM | POA: Diagnosis present

## 2020-12-09 DIAGNOSIS — O9081 Anemia of the puerperium: Secondary | ICD-10-CM | POA: Diagnosis not present

## 2020-12-09 DIAGNOSIS — O24410 Gestational diabetes mellitus in pregnancy, diet controlled: Secondary | ICD-10-CM | POA: Diagnosis present

## 2020-12-09 DIAGNOSIS — R7309 Other abnormal glucose: Secondary | ICD-10-CM

## 2020-12-09 DIAGNOSIS — D62 Acute posthemorrhagic anemia: Secondary | ICD-10-CM | POA: Diagnosis not present

## 2020-12-09 LAB — CBC
HCT: 39.3 % (ref 36.0–46.0)
Hemoglobin: 13.8 g/dL (ref 12.0–15.0)
MCH: 35.5 pg — ABNORMAL HIGH (ref 26.0–34.0)
MCHC: 35.1 g/dL (ref 30.0–36.0)
MCV: 101 fL — ABNORMAL HIGH (ref 80.0–100.0)
Platelets: 121 10*3/uL — ABNORMAL LOW (ref 150–400)
RBC: 3.89 MIL/uL (ref 3.87–5.11)
RDW: 12.1 % (ref 11.5–15.5)
WBC: 9.6 10*3/uL (ref 4.0–10.5)
nRBC: 0 % (ref 0.0–0.2)

## 2020-12-09 LAB — RUPTURE OF MEMBRANE (ROM)PLUS: Rom Plus: POSITIVE

## 2020-12-09 LAB — GLUCOSE, CAPILLARY: Glucose-Capillary: 107 mg/dL — ABNORMAL HIGH (ref 70–99)

## 2020-12-09 LAB — RESP PANEL BY RT-PCR (FLU A&B, COVID) ARPGX2
Influenza A by PCR: NEGATIVE
Influenza B by PCR: NEGATIVE
SARS Coronavirus 2 by RT PCR: NEGATIVE

## 2020-12-09 LAB — TYPE AND SCREEN
ABO/RH(D): O POS
Antibody Screen: NEGATIVE

## 2020-12-09 LAB — RPR: RPR Ser Ql: NONREACTIVE

## 2020-12-09 LAB — ABO/RH: ABO/RH(D): O POS

## 2020-12-09 MED ORDER — OXYTOCIN-SODIUM CHLORIDE 30-0.9 UT/500ML-% IV SOLN
INTRAVENOUS | Status: AC
  Filled 2020-12-09: qty 500

## 2020-12-09 MED ORDER — DIBUCAINE (PERIANAL) 1 % EX OINT: 1.0000 "application " | TOPICAL_OINTMENT | CUTANEOUS | Status: DC | PRN

## 2020-12-09 MED ORDER — FENTANYL 2.5 MCG/ML W/ROPIVACAINE 0.15% IN NS 100 ML EPIDURAL (ARMC)
12.0000 mL/h | EPIDURAL | Status: DC
Start: 2020-12-09 — End: 2020-12-09
  Administered 2020-12-09: 12 mL/h via EPIDURAL

## 2020-12-09 MED ORDER — PHENYLEPHRINE 40 MCG/ML (10ML) SYRINGE FOR IV PUSH (FOR BLOOD PRESSURE SUPPORT): 80.0000 ug | PREFILLED_SYRINGE | INTRAVENOUS | Status: DC | PRN

## 2020-12-09 MED ORDER — WITCH HAZEL-GLYCERIN EX PADS: 1.0000 "application " | MEDICATED_PAD | CUTANEOUS | Status: DC | PRN

## 2020-12-09 MED ORDER — EPHEDRINE 5 MG/ML INJ: 10.0000 mg | INTRAVENOUS | Status: DC | PRN

## 2020-12-09 MED ORDER — SENNOSIDES-DOCUSATE SODIUM 8.6-50 MG PO TABS
2.0000 | ORAL_TABLET | ORAL | Status: DC
  Administered 2020-12-09: 2 via ORAL
  Filled 2020-12-09: qty 2

## 2020-12-09 MED ORDER — LIDOCAINE HCL (PF) 1 % IJ SOLN
INTRAMUSCULAR | Status: DC | PRN
  Administered 2020-12-09: 3 mL via SUBCUTANEOUS

## 2020-12-09 MED ORDER — AMMONIA AROMATIC IN INHA
RESPIRATORY_TRACT | Status: AC
  Filled 2020-12-09: qty 10

## 2020-12-09 MED ORDER — SODIUM CHLORIDE 0.9 % IV SOLN
INTRAVENOUS | Status: DC | PRN
  Administered 2020-12-09: 10 mL via EPIDURAL

## 2020-12-09 MED ORDER — LACTATED RINGERS IV SOLN
500.0000 mL | Freq: Once | INTRAVENOUS | Status: AC
Start: 2020-12-09 — End: 2020-12-09
  Administered 2020-12-09: 500 mL via INTRAVENOUS

## 2020-12-09 MED ORDER — SIMETHICONE 80 MG PO CHEW: 80.0000 mg | CHEWABLE_TABLET | ORAL | Status: DC | PRN

## 2020-12-09 MED ORDER — OXYTOCIN-SODIUM CHLORIDE 30-0.9 UT/500ML-% IV SOLN: 2.5000 [IU]/h | INTRAVENOUS | Status: DC

## 2020-12-09 MED ORDER — BUTORPHANOL TARTRATE 1 MG/ML IJ SOLN
1.0000 mg | INTRAMUSCULAR | Status: DC | PRN
  Administered 2020-12-09: 1 mg via INTRAVENOUS

## 2020-12-09 MED ORDER — ACETAMINOPHEN 325 MG PO TABS: 650.0000 mg | ORAL_TABLET | ORAL | Status: DC | PRN

## 2020-12-09 MED ORDER — BUTORPHANOL TARTRATE 1 MG/ML IJ SOLN
INTRAMUSCULAR | Status: AC
  Filled 2020-12-09: qty 1

## 2020-12-09 MED ORDER — OXYCODONE-ACETAMINOPHEN 5-325 MG PO TABS: 2.0000 | ORAL_TABLET | ORAL | Status: DC | PRN

## 2020-12-09 MED ORDER — COCONUT OIL OIL
1.0000 "application " | TOPICAL_OIL | Status: DC | PRN
  Filled 2020-12-09: qty 120

## 2020-12-09 MED ORDER — LIDOCAINE HCL (PF) 1 % IJ SOLN: 30.0000 mL | INTRAMUSCULAR | Status: DC | PRN

## 2020-12-09 MED ORDER — OXYCODONE-ACETAMINOPHEN 5-325 MG PO TABS: 1.0000 | ORAL_TABLET | ORAL | Status: DC | PRN

## 2020-12-09 MED ORDER — ONDANSETRON HCL 4 MG/2ML IJ SOLN: 4.0000 mg | INTRAMUSCULAR | Status: DC | PRN

## 2020-12-09 MED ORDER — LIDOCAINE-EPINEPHRINE (PF) 1.5 %-1:200000 IJ SOLN
INTRAMUSCULAR | Status: DC | PRN
  Administered 2020-12-09: 3 mL via EPIDURAL

## 2020-12-09 MED ORDER — BENZOCAINE-MENTHOL 20-0.5 % EX AERO
1.0000 "application " | INHALATION_SPRAY | CUTANEOUS | Status: DC | PRN
  Filled 2020-12-09: qty 56

## 2020-12-09 MED ORDER — ACETAMINOPHEN 325 MG PO TABS
650.0000 mg | ORAL_TABLET | ORAL | Status: DC | PRN
  Administered 2020-12-09 – 2020-12-10 (×2): 650 mg via ORAL
  Filled 2020-12-09 (×2): qty 2

## 2020-12-09 MED ORDER — OXYTOCIN BOLUS FROM INFUSION
333.0000 mL | Freq: Once | INTRAVENOUS | Status: AC
  Administered 2020-12-09: 333 mL via INTRAVENOUS

## 2020-12-09 MED ORDER — SOD CITRATE-CITRIC ACID 500-334 MG/5ML PO SOLN: 30.0000 mL | ORAL | Status: DC | PRN

## 2020-12-09 MED ORDER — MISOPROSTOL 200 MCG PO TABS
ORAL_TABLET | ORAL | Status: AC
  Filled 2020-12-09: qty 4

## 2020-12-09 MED ORDER — IBUPROFEN 600 MG PO TABS
600.0000 mg | ORAL_TABLET | Freq: Four times a day (QID) | ORAL | Status: DC
  Administered 2020-12-09 – 2020-12-10 (×5): 600 mg via ORAL
  Filled 2020-12-09 (×5): qty 1

## 2020-12-09 MED ORDER — ONDANSETRON HCL 4 MG PO TABS
4.0000 mg | ORAL_TABLET | ORAL | Status: DC | PRN
  Filled 2020-12-09: qty 1

## 2020-12-09 MED ORDER — LIDOCAINE HCL (PF) 1 % IJ SOLN
INTRAMUSCULAR | Status: AC
  Filled 2020-12-09: qty 30

## 2020-12-09 MED ORDER — DIPHENHYDRAMINE HCL 50 MG/ML IJ SOLN: 12.5000 mg | INTRAMUSCULAR | Status: DC | PRN

## 2020-12-09 MED ORDER — PRENATAL MULTIVITAMIN CH
1.0000 | ORAL_TABLET | Freq: Every day | ORAL | Status: DC
  Administered 2020-12-09 – 2020-12-10 (×2): 1 via ORAL
  Filled 2020-12-09 (×2): qty 1

## 2020-12-09 MED ORDER — ONDANSETRON HCL 4 MG/2ML IJ SOLN: 4.0000 mg | Freq: Four times a day (QID) | INTRAMUSCULAR | Status: DC | PRN

## 2020-12-09 MED ORDER — DIPHENHYDRAMINE HCL 25 MG PO CAPS: 25.0000 mg | ORAL_CAPSULE | Freq: Four times a day (QID) | ORAL | Status: DC | PRN

## 2020-12-09 MED ORDER — LACTATED RINGERS IV SOLN: 500.0000 mL | INTRAVENOUS | Status: DC | PRN

## 2020-12-09 MED ORDER — OXYTOCIN 10 UNIT/ML IJ SOLN
INTRAMUSCULAR | Status: AC
  Filled 2020-12-09: qty 2

## 2020-12-09 MED ORDER — FENTANYL 2.5 MCG/ML W/ROPIVACAINE 0.15% IN NS 100 ML EPIDURAL (ARMC)
EPIDURAL | Status: AC
  Filled 2020-12-09: qty 100

## 2020-12-09 MED ORDER — LACTATED RINGERS IV SOLN: INTRAVENOUS | Status: DC

## 2020-12-09 NOTE — Lactation Note (Addendum)
This note was copied from a baby's chart. Lactation Consultation Note  Patient Name: Haley Bell [id number] Date: 12/09/2020 Reason for consult: Initial assessment;Primapara;Term;Other (Comment) (mom IDM, baby with low bs) Age:29 hours  Maternal Data Has patient been taught Hand Expression?: Yes Does the patient have breastfeeding experience prior to this delivery?: No  Feeding Mother's Current Feeding Choice: Breast Milk Blood sugar decreased, mom assisted with latching baby in cradle hold on right breast, baby rooting and eager once stimulated, nursing well with swallows observed, mom easily expresses colostrum LATCH Score Latch: Grasps breast easily, tongue down, lips flanged, rhythmical sucking.  Audible Swallowing: Spontaneous and intermittent  Type of Nipple: Everted at rest and after stimulation  Comfort (Breast/Nipple): Soft / non-tender  Hold (Positioning): Assistance needed to correctly position infant at breast and maintain latch.  LATCH Score: 9   Lactation Tools Discussed/Used  LC name and no written on white board  Interventions Interventions: Breast feeding basics reviewed;Assisted with latch;Skin to skin;Hand express;Adjust position;Education  Discharge Pump: Personal WIC Program: No  Consult Status Consult Status: PRN    [name] 12/09/2020, 11:19 AM

## 2020-12-09 NOTE — Progress Notes (Signed)
Pt arrived from ED with C/O LOF. Pt states her water broke at 0010 and woke up her up from a sleep. She stated when she rolled over she felt and heard a op and had a large gush of fluid. EFM and TOCO applied and explained to patient. Will continue to monitor and update MD. Patient rates her pain a 6 and C/O lower abdominal pain that comes and goes described as sharp and cramping, "period cramping".

## 2020-12-09 NOTE — Anesthesia Preprocedure Evaluation (Signed)
Anesthesia Evaluation  Patient identified by MRN, date of birth, ID band Patient awake    Reviewed: Allergy & Precautions, NPO status , Patient's Chart, lab work & pertinent test results  History of Anesthesia Complications Negative for: history of anesthetic complications  Airway Mallampati: II  TM Distance: >3 FB Neck ROM: Full    Dental no notable dental hx. (+) Teeth Intact   Pulmonary neg pulmonary ROS, neg sleep apnea, neg COPD, Patient abstained from smoking.Not current smoker,    Pulmonary exam normal breath sounds clear to auscultation       Cardiovascular Exercise Tolerance: Good METS(-) hypertension(-) CAD and (-) Past MI negative cardio ROS  (-) dysrhythmias  Rhythm:Regular Rate:Normal - Systolic murmurs    Neuro/Psych PSYCHIATRIC DISORDERS Anxiety negative neurological ROS     GI/Hepatic neg GERD  ,(+)     (-) substance abuse  ,   Endo/Other  diabetes, Gestational  Renal/GU negative Renal ROS     Musculoskeletal   Abdominal   Peds  Hematology   Anesthesia Other Findings Past Medical History: 07/28/2015: Abnormal thyroid blood test 07/20/2015: GAD (generalized anxiety disorder) No date: Gestational diabetes 07/20/2015: Insomnia  Reproductive/Obstetrics (+) Pregnancy                             Anesthesia Physical Anesthesia Plan  ASA: 2  Anesthesia Plan: Epidural   Post-op Pain Management:    Induction:   PONV Risk Score and Plan: 2 and Treatment may vary due to age or medical condition and Ondansetron  Airway Management Planned: Natural Airway  Additional Equipment:   Intra-op Plan:   Post-operative Plan:   Informed Consent: I have reviewed the patients History and Physical, chart, labs and discussed the procedure including the risks, benefits and alternatives for the proposed anesthesia with the patient or authorized representative who has indicated his/her  understanding and acceptance.       Plan Discussed with: Surgeon  Anesthesia Plan Comments: (Discussed R/B/A of neuraxial anesthesia technique with patient: - rare risks of spinal/epidural hematoma, nerve damage, infection - Risk of PDPH - Risk of itching - Risk of nausea and vomiting - Risk of poor block necessitating replacement of epidural. Patient voiced understanding.)        Anesthesia Quick Evaluation

## 2020-12-09 NOTE — H&P (Signed)
Obstetric H&P   Chief Complaint: Leaking fluid  Prenatal Care Provider: [facility]  History of Present Illness: 30 y.o. G1P0000 29w1dby 12/15/2020, by Ultrasound presenting to L&D with gross rupture of membranes.  +FM, no VB, regular contractions.  Pregnancy uncomplicated other than a diagnosis of gestational diabetes diet controlled.    Pregravid weight 57.2 kg Total Weight Gain 20.9 kg  pregnancy1  Problems (from 03/09/20 to present)     Problem Noted Resolved   Elevated glucose tolerance test 10/05/2020 by FImagene Riches CNM No   Overview Signed 10/05/2020  5:40 PM by FImagene Riches CNM    4/20 1hr GTT result 163. 3 hr test ordered and patient notified via My Chart as her VM was full.       Supervision of high risk pregnancy, antepartum 04/22/2020 by HGae Dry MD No   Overview Addendum 11/28/2020  5:24 PM by SMalachy Mood MD    [facility] Prenatal Labs  Dating [place]6 weeks Blood type: O pos  Genetic Screen CF neg, Fragile-X neg, SMA neg NIPS:Normal XX Antibody: negative  Anatomic [place] nml w recheck Rubella: Immune Varicella: Immune  GTT   Third trimester: 163. 3hr ordered 4/20 RPR: NR  Rhogam N/A HBsAg: negative  TDaP vaccine           Flu Shot: HIV: negative  Baby Food           Breast                     GBS: negative  Contraception            POP Pap:04/2020  CBB  no   CS/VBAC n/a   Support Person Husband                 Review of Systems: 10 point review of systems negative unless otherwise noted in HPI  Past Medical History: Patient Active Problem List   Diagnosis Date Noted   Labor and delivery indication for care or intervention 12/09/2020   Normal labor 12/09/2020   Diet controlled gestational diabetes mellitus (GDM) in third trimester 10/24/2020   Elevated glucose tolerance test 10/05/2020    4/20 1hr GTT result 163. 3 hr test ordered and patient notified via My Chart as her VM was full.     Supervision of high risk pregnancy,  antepartum 04/22/2020    [facility] Prenatal [facility]  Dating [place]6 weeks Blood type: O pos  Genetic Screen CF neg, Fragile-X neg, SMA neg NIPS:Normal XX Antibody: negative  Anatomic [place] nml w recheck Rubella: Immune Varicella: Immune  GTT   Third trimester: 163. 3hr ordered 4/20 RPR: NR  Rhogam N/A HBsAg: negative  TDaP vaccine           Flu Shot: HIV: negative  Baby Food           Breast                     GBS: negative  Contraception            POP Pap:04/2020  CBB  no   CS/VBAC n/a   Support Person Husband         Fatigue 02/21/2016   Abnormal thyroid blood test 07/28/2015   GAD (generalized anxiety disorder) 07/20/2015   Insomnia 07/20/2015    Past Surgical History: Past Surgical History:  Procedure Laterality Date   [place]IX ([facility] [place])  TONSILLECTOMY AND ADENOIDECTOMY     WISDOM TOOTH EXTRACTION      Past Obstetric History: # 1 - Date: None, Sex: None, Weight: None, GA: None, Delivery: None, Apgar1: None, Apgar5: None, Living: None, Birth Comments: None   Past Gynecologic History:  Family History: Family History  Problem Relation Age of Onset   Ovarian cysts Mother    Diabetes Maternal Grandmother    Hypertension Maternal Grandmother     Social History: Social History   Socioeconomic History   Marital status: Married    Spouse name: Shawn    Number of children: 0   Years of education: Not on file   Highest education level: Bachelor's degree (e.g., BA, AB, BS)  Occupational History   Not on file  Tobacco Use   Smoking status: Never   Smokeless tobacco: Never  Vaping Use   Vaping Use: Never used  Substance and Sexual Activity   Alcohol use: No   Drug use: No   Sexual activity: Yes    Partners: Male    Birth control/protection: None  Other Topics Concern   Not on file  Social History Narrative   Not on file   Social Determinants of Health   Financial Resource Strain: Not on file  Food Insecurity: Not on file  Transportation  Needs: Not on file  Physical Activity: Not on file  Stress: Not on file  Social Connections: Not on file  Intimate Partner Violence: Not on file    Medications: Prior to Admission medications   Medication Sig Start Date End Date Taking? Authorizing Provider  Prenatal Vit-Fe Fumarate-FA (MULTIVITAMIN-PRENATAL) 27-0.8 MG TABS tablet Take 1 tablet by mouth daily at 12 noon.   Yes [provider]  glucose blood test strip Use as instructed 4 times daily.  One Touch Verio. 11/21/20   Gae Dry, MD  OneTouch Delica Lancets 11X MISC Use as directoed 4 times daily 11/07/20   Gae Dry, MD    Allergies: No Known Allergies  Physical Exam: Vitals: Temperature 99 F (37.2 C), temperature source Oral, last menstrual period 03/14/2020.  FHT: 150, moderate, +accels, no decels Toco: q2-35mn  General: NAD HEENT: normocephalic, anicteric Pulmonary: No increased work of breathing Cardiovascular: RRR, distal pulses 2+ Abdomen: Gravid, non-tender Leopolds: vtx Genitourinary:  Dilation: 3 Effacement (%): 100 Station: 0 Presentation: Vertex Exam by:: DM  Extremities: no edema, erythema, or tenderness Neurologic: Grossly intact Psychiatric: mood appropriate, affect full  Labs: Results for orders placed or performed during the hospital encounter of 12/09/20 (from the past 24 hour(s))  ROM Plus ([facility] only)     Status: None   Collection Time: 12/09/20  1:59 AM  Result Value Ref Range   Rom Plus POSITIVE     Assessment: 30y.o. G1P0000 377w1dy 12/15/2020, by Ultrasound with diet controlled GDM, presenting in labor with SROM  Plan: 1) Normal labor - admit for term labor expectant management  2) Fetus - cat I tracing  3) PNL - Blood type O/Positive/-- (11/05 1356) / Anti-bodyscreen Negative (11/05 1356) / Rubella 1.17 (11/05 1356) / Varicella Immune / RPR Non Reactive (04/19 0924) / HBsAg Negative (11/05 1356) / HIV Non Reactive (04/19 0924) / GBS Negative/-- (06/06  1507)  4) Immunization History -  Immunization History  Administered Date(s) Administered   DTaP 08/01/1992, 09/23/1992, 12/23/1992, 08/29/1993, 09/01/1996   Hepatitis B 1208-07-9300/14/1994, 03/10/1993   HiB (PRP-OMP) 08/01/1992, 09/23/1992, 12/23/1992, 08/29/1993   IPV 08/01/1992, 09/23/1992, 08/29/1993, 09/01/1996   MMR 08/29/1993, 09/01/1996  Tdap 05/18/2008, 10/10/2020    5) GDM - diet controlled, blood glucose q2hrs  6) Disposition - pending delivery anticipate vaginal  Malachy Mood, MD, [place], [facility] 12/09/2020, 2:22 AM

## 2020-12-09 NOTE — Progress Notes (Signed)
Subjective:  Doing well 5 hours postpartum: she is tolerating regular diet, her pain is well controlled with PO medication, she is ambulating and voiding without difficulty. She reports baby has done well latching/nursing.  Objective:  Vital signs in last 24 hours: Temp:  [98.1 F (36.7 C)-99.1 F (37.3 C)] 98.3 F (36.8 C) (06/24 0907) Pulse Rate:  [66-118] 80 (06/24 0907) Resp:  [16-20] 20 (06/24 0907) BP: (100-160)/(53-93) 117/83 (06/24 0907) SpO2:  [98 %-100 %] 99 % (06/24 0907) Weight:  [78 kg] 78 kg (06/23 1633)    General: NAD Pulmonary: no increased work of breathing Abdomen: non-distended, non-tender, fundus firm at level of umbilicus Extremities: no edema, no erythema, no tenderness  Results for orders placed or performed during the hospital encounter of 12/09/20 (from the past 72 hour(s))  ROM Plus ([facility] only)     Status: None   Collection Time: 12/09/20  1:59 AM  Result Value Ref Range   Rom Plus POSITIVE     Comment: Performed at [facility], [address], [place], [place] [address]  Resp Panel by RT-PCR (Flu A&B, Covid) Nasopharyngeal Swab     Status: None   Collection Time: 12/09/20  2:13 AM   Specimen: Nasopharyngeal Swab; Nasopharyngeal(NP) swabs in vial transport medium  Result Value Ref Range   SARS Coronavirus 2 by RT PCR NEGATIVE NEGATIVE    Comment: (NOTE) SARS-CoV-2 target nucleic acids are NOT DETECTED.  The SARS-CoV-2 RNA is generally detectable in upper respiratory specimens during the acute phase of infection. The lowest concentration of SARS-CoV-2 viral copies this assay can detect is 138 copies/mL. A negative result does not preclude SARS-Cov-2 infection and should not be used as the sole basis for treatment or other patient management decisions. A negative result may occur with  improper specimen collection/handling, submission of specimen other than nasopharyngeal swab, presence of viral mutation(s) within the areas targeted  by this assay, and inadequate number of viral copies(<138 copies/mL). A negative result must be combined with clinical observations, patient history, and epidemiological information. The expected result is Negative.  Fact Sheet for Patients:  BloggerCourse.com  Fact Sheet for Healthcare Providers:  SeriousBroker.it  This test is no t yet approved or cleared by the [place] FDA and  has been authorized for detection and/or diagnosis of SARS-CoV-2 by FDA under an Emergency Use Authorization (EUA). This EUA will remain  in effect (meaning this test can be used) for the duration of the COVID-19 declaration under Section 564(b)(1) of the Act, 21 U.S.C.section 360bbb-3(b)(1), unless the authorization is terminated  or revoked sooner.       Influenza A by PCR NEGATIVE NEGATIVE   Influenza B by PCR NEGATIVE NEGATIVE    Comment: (NOTE) The Xpert Xpress SARS-CoV-2/FLU/RSV plus assay is intended as an aid in the diagnosis of influenza from Nasopharyngeal swab specimens and should not be used as a sole basis for treatment. Nasal washings and aspirates are unacceptable for Xpert Xpress SARS-CoV-2/FLU/RSV testing.  Fact Sheet for Patients: BloggerCourse.com  Fact Sheet for Healthcare Providers: SeriousBroker.it  This test is not yet approved or cleared by the [place] FDA and has been authorized for detection and/or diagnosis of SARS-CoV-2 by FDA under an Emergency Use Authorization (EUA). This EUA will remain in effect (meaning this test can be used) for the duration of the COVID-19 declaration under Section 564(b)(1) of the Act, 21 U.S.C. section 360bbb-3(b)(1), unless the authorization is terminated or revoked.  Performed at [facility], [address] [place], [place] [address]   CBC     Status: Abnormal   Collection Time: 12/09/20  2:13 AM  Result Value Ref  Range   WBC 9.6 4.0 - 10.5 K/uL   RBC 3.89 3.87 - 5.11 MIL/uL   Hemoglobin 13.8 12.0 - 15.0 g/dL   HCT 02.7 74.1 - 28.7 %   MCV 101.0 (H) 80.0 - 100.0 fL   MCH 35.5 (H) 26.0 - 34.0 pg   MCHC 35.1 30.0 - 36.0 g/dL   RDW 86.7 67.2 - 09.4 %   Platelets 121 (L) 150 - 400 K/uL   nRBC 0.0 0.0 - 0.2 %    Comment: Performed at [facility], [address] [place], [place] [address]  Type and screen [facility]     Status: None   Collection Time: 12/09/20  2:13 AM  Result Value Ref Range   ABO/RH(D) O POS    Antibody Screen NEG    Sample Expiration      12/12/2020,2359 Performed at [facility], [address] [place], [place] [address]   Glucose, capillary     Status: Abnormal   Collection Time: 12/09/20  2:26 AM  Result Value Ref Range   Glucose-Capillary 107 (H) 70 - 99 mg/dL    Comment: Glucose reference range applies only to samples taken after fasting for at least 8 hours.   Comment 1 Document in Chart   ABO/Rh     Status: None   Collection Time: 12/09/20  3:36 AM  Result Value Ref Range   ABO/RH(D)      O POS Performed at [facility], [address] [place], [place] [address]     Assessment:   30 y.o. G1P1001 postpartum day # 0, lactating  Plan:    1) Acute blood loss anemia - hemodynamically stable and asymptomatic - po ferrous sulfate  2) Blood Type --/--/O POS Performed at [facility], [address] [place], [place] [address]  [phone]) / Ishmael Holter 1.17 (11/05 1356) / Varicella Immune  3) TDAP status up to date  4) Feeding plan breast  5)  Education given regarding options for contraception, as well as compatibility with breast feeding if applicable.  Patient plans on oral progesterone-only contraceptive for contraception.  6) Disposition: continue current care   Tresea Mall, CNM [facility] 12/09/2020, 10:21 AM

## 2020-12-09 NOTE — Progress Notes (Signed)
Kiwi vacuum applied by Dr. Bonney Aid at [phone] and one running pull with contractions. No pop-off. Vigorous spontaneous infant delivery at 0547.

## 2020-12-09 NOTE — Discharge Summary (Signed)
OB Discharge Summary     Patient Name: Haley Bell DOB: 04/19/1991 MRN: [id number]  Date of admission: 12/09/2020 Delivering MD: Vena Austria   Date of discharge: 12/10/2020  Admitting diagnosis: Labor and delivery indication for care or intervention [O75.9] Normal labor [O80, Z37.9] Intrauterine pregnancy: [redacted]w[redacted]d     Secondary diagnosis:  Principal Problem:   Supervision of high risk pregnancy, antepartum Active Problems:   Diet controlled gestational diabetes mellitus (GDM) in third trimester   Vacuum-assisted vaginal delivery   Postpartum care following vaginal delivery   Encounter for care or examination of lactating mother  Additional problems: none     Discharge diagnosis: Term Pregnancy Delivered                                                                                                Post partum procedures: none  Augmentation: N/A  Complications: None  Hospital course:  Onset of Labor With Vaginal Delivery      30 y.o. yo G1P0000 at [redacted]w[redacted]d was admitted in Latent Labor on 12/09/2020. Patient had an uncomplicated labor course as follows:  Membrane Rupture Time/Date: 12:00 AM ,12/09/2020   Delivery Method:Vaginal, Vacuum (Extractor)  Episiotomy: None  Lacerations:  1st degree  Patient had an uncomplicated postpartum course.  She is ambulating, tolerating a regular diet, passing flatus, and urinating well. Patient is discharged home in stable condition on 12/10/20.  Newborn Data: Birth date:12/09/2020  Birth time:5:47 AM  Gender:Female  Living status:Living  Apgars:8 ,9  Weight:3540 g   Physical exam  Vitals:   12/09/20 1023 12/09/20 1615 12/09/20 2344 12/10/20 0746  BP: 130/74 125/80 117/68 121/74  Pulse: 77 69 70 81  Resp: 18 20 20 18   Temp: 97.8 F (36.6 C) 97.7 F (36.5 C) 98.3 F (36.8 C) 98.2 F (36.8 C)  TempSrc: Oral Oral Oral Oral  SpO2: 98%  100% 100%   General: alert, cooperative, and no distress Lochia: appropriate Uterine  Fundus: firm Incision: N/A DVT Evaluation: No evidence of DVT seen on physical exam. Labs: Lab Results  Component Value Date   WBC 11.5 (H) 12/10/2020   HGB 11.0 (L) 12/10/2020   HCT 31.4 (L) 12/10/2020   MCV 104.3 (H) 12/10/2020   PLT 95 (L) 12/10/2020   CMP Latest Ref Rng & Units 04/18/2018  Glucose 65 - 139 mg/dL 91  BUN 7 - 25 mg/dL 12  Creatinine 13/06/2017 - 1.79 mg/dL 1.50  Sodium 5.69 - 794 mmol/L 137  Potassium 3.5 - 5.3 mmol/L 3.9  Chloride 98 - 110 mmol/L 103  CO2 20 - 32 mmol/L 27  Calcium 8.6 - 10.2 mg/dL 9.4  Total Protein 6.1 - 8.1 g/dL 7.1  Total Bilirubin 0.2 - 1.2 mg/dL 0.3  Alkaline Phos 39 - 117 IU/L -  AST 10 - 30 U/L 17  ALT 6 - 29 U/L 8    Discharge instruction: per After Visit Summary and "Baby and Me Booklet".  After visit meds:  Allergies as of 12/10/2020   No Known Allergies      Medication List     STOP taking these  medications    glucose blood test strip   OneTouch Delica Lancets 30G Misc       TAKE these medications    multivitamin-prenatal 27-0.8 MG Tabs tablet Take 1 tablet by mouth daily at 12 noon.        Diet: routine diet  Activity: Advance as tolerated. Pelvic rest for 6 weeks.   Outpatient follow up:1 week Follow up Appt:No future appointments. Follow up Visit:No follow-ups on file.  Postpartum contraception: Progesterone only pills    Newborn Delivery   Birth date/time: 12/09/2020 05:47:00 Delivery type: Vaginal, Vacuum (Extractor)      Baby Feeding: Breast Disposition:home with mother   12/10/2020 Tresea Mall, CNM

## 2020-12-09 NOTE — OB Triage Note (Signed)
Called Dr. Bonney Aid and notified him that patient is 6cm and grossly ruptured with membranes coming from the vagina with rectal/vaginal pressure.

## 2020-12-09 NOTE — OB Triage Note (Signed)
Notified Bonney Aid, MD that patient has arrived and SBAR given. New orders given see orders. Will continue to monitor patient.

## 2020-12-10 LAB — CBC
HCT: 31.4 % — ABNORMAL LOW (ref 36.0–46.0)
Hemoglobin: 11 g/dL — ABNORMAL LOW (ref 12.0–15.0)
MCH: 36.5 pg — ABNORMAL HIGH (ref 26.0–34.0)
MCHC: 35 g/dL (ref 30.0–36.0)
MCV: 104.3 fL — ABNORMAL HIGH (ref 80.0–100.0)
Platelets: 95 10*3/uL — ABNORMAL LOW (ref 150–400)
RBC: 3.01 MIL/uL — ABNORMAL LOW (ref 3.87–5.11)
RDW: 12.5 % (ref 11.5–15.5)
WBC: 11.5 10*3/uL — ABNORMAL HIGH (ref 4.0–10.5)
nRBC: 0 % (ref 0.0–0.2)

## 2020-12-10 NOTE — Progress Notes (Signed)
Patient discharged home with infant. Discharge instructions, prescriptions and follow up appointment given to and reviewed with patient. Patient verbalized understanding  

## 2020-12-16 ENCOUNTER — Other Ambulatory Visit: Payer: Self-pay

## 2020-12-16 ENCOUNTER — Ambulatory Visit (INDEPENDENT_AMBULATORY_CARE_PROVIDER_SITE_OTHER): Payer: BC Managed Care – PPO | Admitting: Obstetrics and Gynecology

## 2020-12-16 NOTE — Patient Instructions (Signed)
[email or web address] 

## 2020-12-16 NOTE — Progress Notes (Signed)
In error

## 2020-12-17 NOTE — Progress Notes (Signed)
Obstetrics & Gynecology Office Visit   Chief Complaint:  Chief Complaint  Patient presents with   Post-op Follow-up    1 wk postpartum and Bp check, burning/stinging at incision when she urinates. RM 4    History of Present Illness: 30 y.o. G1P1001 being seen for follow up blood pressure check today.  The patient is  postpartum The established diagnosis for the patient is  gestational hypertension (elevated BP intrapartum) .  She is currently on no antihypertensives.  She reports no current symptoms attributable to her blood pressure.  Medication list reviewed medications which may contribute to BP elevation were not noted and no medications contraindicated for use in patient with current hypertension were noted.  Some irritation from laceration.    Review of Systems: Review of Systems  Constitutional: Negative.   Gastrointestinal:  Negative for abdominal pain.  Genitourinary: Negative.   Neurological:  Negative for headaches.    Past Medical History:  Past Medical History:  Diagnosis Date   Abnormal thyroid blood test 07/28/2015   GAD (generalized anxiety disorder) 07/20/2015   Gestational diabetes    Insomnia 07/20/2015    Past Surgical History:  Past Surgical History:  Procedure Laterality Date   NM RENAL LASIX ([facility] HX)     TONSILLECTOMY AND ADENOIDECTOMY     WISDOM TOOTH EXTRACTION      Gynecologic History: No LMP recorded.  Obstetric History: G1P1001  Family History:  Family History  Problem Relation Age of Onset   Ovarian cysts Mother    Diabetes Maternal Grandmother    Hypertension Maternal Grandmother     Social History:  Social History   Socioeconomic History   Marital status: Married    Spouse name: Shawn    Number of children: 0   Years of education: Not on file   Highest education level: Bachelor's degree (e.g., BA, AB, BS)  Occupational History   Not on file  Tobacco Use   Smoking status: Never   Smokeless tobacco: Never  Vaping Use    Vaping Use: Never used  Substance and Sexual Activity   Alcohol use: No   Drug use: No   Sexual activity: Yes    Partners: Male    Birth control/protection: None  Other Topics Concern   Not on file  Social History Narrative   Not on file   Social Determinants of Health   Financial Resource Strain: Not on file  Food Insecurity: Not on file  Transportation Needs: Not on file  Physical Activity: Not on file  Stress: Not on file  Social Connections: Not on file  Intimate Partner Violence: Not on file    Allergies:  No Known Allergies  Medications: Prior to Admission medications   Medication Sig Start Date End Date Taking? Authorizing Provider  Prenatal Vit-Fe Fumarate-FA (MULTIVITAMIN-PRENATAL) 27-0.8 MG TABS tablet Take 1 tablet by mouth daily at 12 noon.    [provider]    Physical Exam Blood pressure 118/74, weight 159 lb (72.1 kg), unknown if currently breastfeeding.  No LMP recorded.  General: NAD HEENT: normocephalic, anicteric Pulmonary: No increased work of breathing Extremities: noedema, no erythema, no tenderness Neurologic: Grossly intact Psychiatric: mood appropriate, affect full  Assessment: 30 y.o. G1P1001 presenting for blood pressure evaluation today  Plan: Problem List Items Addressed This Visit   None Visit Diagnoses     SVD (spontaneous vaginal delivery)    -  Primary       1) Blood pressure - blood pressure at  today's visit is normotensive.  As a result antihypertensive therapy is currently not warranted. - additional blood work was not obtained  2) Return in about 5 weeks (around 01/20/2021) for 6 week postpartum.    Vena Austria, MD, [facility], [facility] 12/17/2020, 11:25 PM

## 2020-12-20 ENCOUNTER — Encounter: Payer: Self-pay | Admitting: Obstetrics & Gynecology

## 2020-12-20 ENCOUNTER — Ambulatory Visit (INDEPENDENT_AMBULATORY_CARE_PROVIDER_SITE_OTHER): Payer: BC Managed Care – PPO | Admitting: Obstetrics & Gynecology

## 2020-12-20 ENCOUNTER — Other Ambulatory Visit: Payer: Self-pay

## 2020-12-20 VITALS — BP 120/80 | Ht 67.0 in | Wt 154.0 lb

## 2020-12-20 DIAGNOSIS — R102 Pelvic and perineal pain: Secondary | ICD-10-CM | POA: Diagnosis not present

## 2020-12-20 NOTE — Anesthesia Postprocedure Evaluation (Signed)
Anesthesia Post Note  Patient: Haley Bell  Procedure(s) Performed: AN AD HOC LABOR EPIDURAL  Patient location during evaluation: Mother Baby Anesthesia Type: Epidural Level of consciousness: awake and alert Pain management: pain level controlled Vital Signs Assessment: post-procedure vital signs reviewed and stable Respiratory status: spontaneous breathing, nonlabored ventilation and respiratory function stable Cardiovascular status: stable Postop Assessment: no headache, no backache and epidural receding Anesthetic complications: no Comments: Patient discharged before able to be evaluated. Per notes and vital signs, no issues.   No notable events documented.   Last Vitals: There were no vitals filed for this visit.  Last Pain: There were no vitals filed for this visit.               Corinda Gubler

## 2020-12-20 NOTE — Progress Notes (Signed)
HPI:      [name] is a 30 y.o. G1P1001, No LMP recorded., presents today for a problem visit.  She complains of pain along her vulva especially around the clitoris and upper introitus.  She had VAVD w First Degree Lac repair 11 days ago, infant doing well.  She initially did well but then 3 days ago was having more intense pain in the above mentioned area, not so much where the stitches were placed.  Noted redness then.  Took Sitz w gradual relief and is much better today (first day we could see her in office due to weekend/holiday).  Denies fever, discharge.   PMHx: She  has a past medical history of Abnormal thyroid blood test (07/28/2015), GAD (generalized anxiety disorder) (07/20/2015), Gestational diabetes, and Insomnia (07/20/2015). Also,  has a past surgical history that includes Tonsillectomy and adenoidectomy; Wisdom tooth extraction; and NM RENAL LASIX ([facility] HX)., family history includes Diabetes in her maternal grandmother; Hypertension in her maternal grandmother; Ovarian cysts in her mother.,  reports that she has never smoked. She has never used smokeless tobacco. She reports that she does not drink alcohol and does not use drugs.  She has a current medication list which includes the following prescription(s): multivitamin-prenatal. Also, has No Known Allergies.  Review of Systems  Constitutional:  Negative for chills, fever and malaise/fatigue.  HENT:  Negative for congestion, sinus pain and sore throat.   Eyes:  Negative for blurred vision and pain.  Respiratory:  Negative for cough and wheezing.   Cardiovascular:  Negative for chest pain and leg swelling.  Gastrointestinal:  Negative for abdominal pain, constipation, diarrhea, heartburn, nausea and vomiting.  Genitourinary:  Negative for dysuria, frequency, hematuria and urgency.  Musculoskeletal:  Negative for back pain, joint pain, myalgias and neck pain.  Skin:  Negative for itching and rash.  Neurological:  Negative  for dizziness, tremors and weakness.  Endo/Heme/Allergies:  Does not bruise/bleed easily.  Psychiatric/Behavioral:  Negative for depression. The patient is not nervous/anxious and does not have insomnia.    Objective: BP 120/80   Ht 5\' 7"  (1.702 m)   Wt 154 lb (69.9 kg)   BMI 24.12 kg/m  Physical Exam Constitutional:      General: She is not in acute distress.    Appearance: She is well-developed.  Genitourinary:     Right Labia: No rash or tenderness.    Left Labia: No tenderness or rash.    Vulva exam comments: Areas of healing in the labia near the superior introitus and peri-clitoral region, as if she had some mild abrasion like birth trauma there that is now healing.  No current erythema or swelling.  No discharge. .     No vaginal erythema or bleeding.      Right Adnexa: not tender and no mass present.    Left Adnexa: not tender and no mass present.    No cervical motion tenderness, discharge, polyp or nabothian cyst.     Uterus is not enlarged.     No uterine mass detected.    Pelvic exam was performed with patient in the lithotomy position.  HENT:     Head: Normocephalic and atraumatic.     Nose: Nose normal.  Abdominal:     General: There is no distension.     Palpations: Abdomen is soft.     Tenderness: There is no abdominal tenderness.  Musculoskeletal:        General: Normal range of motion.  Neurological:  Mental Status: She is alert and oriented to person, place, and time.     Cranial Nerves: No cranial nerve deficit.  Skin:    General: Skin is warm and dry.  Psychiatric:        Attention and Perception: Attention normal.        Mood and Affect: Mood and affect normal.        Speech: Speech normal.        Behavior: Behavior normal.        Thought Content: Thought content normal.        Judgment: Judgment normal.    ASSESSMENT/PLAN:    Problem List Items Addressed This Visit     Vulvar pain    -  Primary/New onset  Recovering from delivery w some  vulvar birth trauma, mild, healing well at this time although over the weekend she may have had mild cellulitis No ABX needed at this time NSAIDs, Ice, Sitz   Annamarie Major, MD, [facility], [facility] 12/20/2020  10:29 AM

## 2020-12-26 ENCOUNTER — Other Ambulatory Visit: Payer: Self-pay | Admitting: Obstetrics and Gynecology

## 2020-12-26 MED ORDER — LIDOCAINE-PRILOCAINE 2.5-2.5 % EX CREA: 1.0000 "application " | TOPICAL_CREAM | CUTANEOUS | 0 refills | Status: DC | PRN

## 2020-12-26 MED ORDER — SERTRALINE HCL 50 MG PO TABS: 50.0000 mg | ORAL_TABLET | Freq: Every day | ORAL | 2 refills | Status: DC

## 2020-12-26 NOTE — Telephone Encounter (Signed)
Medication follow up sometime next week can be phone or in office

## 2021-01-03 ENCOUNTER — Ambulatory Visit (INDEPENDENT_AMBULATORY_CARE_PROVIDER_SITE_OTHER): Payer: BC Managed Care – PPO | Admitting: Obstetrics and Gynecology

## 2021-01-03 ENCOUNTER — Encounter: Payer: Self-pay | Admitting: Obstetrics and Gynecology

## 2021-01-03 DIAGNOSIS — F53 Postpartum depression: Secondary | ICD-10-CM | POA: Diagnosis not present

## 2021-01-03 DIAGNOSIS — O99345 Other mental disorders complicating the puerperium: Secondary | ICD-10-CM | POA: Diagnosis not present

## 2021-01-03 MED ORDER — TRAZODONE HCL 50 MG PO TABS: 50.0000 mg | ORAL_TABLET | Freq: Every evening | ORAL | 2 refills | Status: DC | PRN

## 2021-01-20 ENCOUNTER — Ambulatory Visit: Payer: BC Managed Care – PPO | Admitting: Obstetrics and Gynecology

## 2021-01-25 NOTE — Progress Notes (Signed)
I connected with Haley Bell on 01/25/21 at  4:20 PM EDT by telephone and verified that I am speaking with the correct person using two identifiers.   I discussed the limitations, risks, security and privacy concerns of performing an evaluation and management service by telephone and the availability of in person appointments. I also discussed with the patient that there may be a patient responsible charge related to this service. The patient expressed understanding and agreed to proceed.  The patient was at home I spoke with the patient from my workstation phone The names of people involved in this encounter were: Haley Bell , and Vena Austria   Obstetrics & Gynecology Office Visit   Chief Complaint:  Chief Complaint  Patient presents with   Follow-up    Phone visit - F/U medication, still having a hard falling asleep, but sleeps ok once she's asleep    History of Present Illness: 30 y.o. G1P1001 presenting for medication follow up for a diagnosis of postpartum anxiety  She is currently being managed with Zoloft 50mg    The patient reports good control of symptoms on her current regimen.  On her current medication regimen still having some problems sleeping.  She has not noted any side-effects or new symptoms.    Vaginal laceration has symptomatically improved significantly.  Review of Systems: Review of Systems  Constitutional: Negative.   Gastrointestinal:  Negative for nausea and vomiting.  Genitourinary: Negative.   Psychiatric/Behavioral: Negative.      Past Medical History:  Past Medical History:  Diagnosis Date   Abnormal thyroid blood test 07/28/2015   GAD (generalized anxiety disorder) 07/20/2015   Gestational diabetes    Insomnia 07/20/2015    Past Surgical History:  Past Surgical History:  Procedure Laterality Date   NM RENAL LASIX ([facility] HX)     TONSILLECTOMY AND ADENOIDECTOMY     WISDOM TOOTH EXTRACTION      Gynecologic History: No LMP  recorded.  Obstetric History: G1P1001  Family History:  Family History  Problem Relation Age of Onset   Ovarian cysts Mother    Diabetes Maternal Grandmother    Hypertension Maternal Grandmother     Social History:  Social History   Socioeconomic History   Marital status: Married    Spouse name: Shawn    Number of children: 0   Years of education: Not on file   Highest education level: Bachelor's degree (e.g., BA, AB, BS)  Occupational History   Not on file  Tobacco Use   Smoking status: Never   Smokeless tobacco: Never  Vaping Use   Vaping Use: Never used  Substance and Sexual Activity   Alcohol use: No   Drug use: No   Sexual activity: Yes    Partners: Male    Birth control/protection: None  Other Topics Concern   Not on file  Social History Narrative   Not on file   Social Determinants of Health   Financial Resource Strain: Not on file  Food Insecurity: Not on file  Transportation Needs: Not on file  Physical Activity: Not on file  Stress: Not on file  Social Connections: Not on file  Intimate Partner Violence: Not on file    Allergies:  No Known Allergies  Medications: Prior to Admission medications   Medication Sig Start Date End Date Taking? Authorizing Provider  Prenatal Vit-Fe Fumarate-FA (MULTIVITAMIN-PRENATAL) 27-0.8 MG TABS tablet Take 1 tablet by mouth daily at 12 noon.   Yes [provider]  sertraline (  ZOLOFT) 50 MG tablet Take 1 tablet (50 mg total) by mouth daily. 12/26/20  Yes Vena Austria, MD  traZODone (DESYREL) 50 MG tablet Take 1 tablet (50 mg total) by mouth at bedtime as needed for sleep. 01/03/21  Yes Vena Austria, MD  lidocaine-prilocaine (EMLA) cream Apply 1 application topically as needed. 12/26/20   Vena Austria, MD    Physical Exam Vitals: There were no vitals filed for this visit. No LMP recorded.  No physical exam as this was a remote telephone visit to promote social distancing during the current  COVID-19 Pandemic   Assessment: 30 y.o. G1P1001 with postpartum anxiety  Plan: Problem List Items Addressed This Visit   None Visit Diagnoses     Postpartum depression    -  Primary   Relevant Medications   traZODone (DESYREL) 50 MG tablet      1) Postpartum anxiety - good response to Zoloft 50mg .  Still some insomnia will add trazodone 50mg  qHS.  2) Telephone time 8:12  3) Return keep 8/12 for 6 week postpartum visit.   , MD, 10/12 OB/GYN, [facility]

## 2021-01-27 ENCOUNTER — Ambulatory Visit: Payer: BC Managed Care – PPO | Admitting: Obstetrics and Gynecology

## 2021-02-03 ENCOUNTER — Encounter: Payer: Self-pay | Admitting: Obstetrics and Gynecology

## 2021-02-03 ENCOUNTER — Ambulatory Visit: Payer: BC Managed Care – PPO | Admitting: Obstetrics and Gynecology

## 2021-02-03 ENCOUNTER — Ambulatory Visit (INDEPENDENT_AMBULATORY_CARE_PROVIDER_SITE_OTHER): Payer: BC Managed Care – PPO | Admitting: Obstetrics and Gynecology

## 2021-02-03 MED ORDER — NORETHINDRONE 0.35 MG PO TABS: 1.0000 | ORAL_TABLET | Freq: Every day | ORAL | 11 refills | Status: DC

## 2021-02-03 MED ORDER — SERTRALINE HCL 50 MG PO TABS: 50.0000 mg | ORAL_TABLET | Freq: Every day | ORAL | 11 refills | Status: DC

## 2021-02-03 NOTE — Progress Notes (Signed)
Postpartum Visit  Chief Complaint:  Chief Complaint  Patient presents with   Post-op Follow-up    6 wk postpartum - no concerns. RM 4    History of Present Illness: Patient is a 30 y.o. G1P1001 presents for postpartum visit.  Date of delivery: 12/09/2020 Type of delivery: Vaginal delivery - Vacuum or forceps assisted  yes vaccum Episiotomy No.  Laceration: yes 1st degree Pregnancy or labor problems:  no Any problems since the delivery:  yes postpartum depression  Newborn Details:  SINGLETON :  1. BabyGender female. Birth weight:   Maternal Details:  Breast or formula feeding: plans to breastfeed Intercourse: No  Contraception after delivery: No  Any bowel or bladder issues: No  Post partum depression/anxiety noted:  no Edinburgh Post-Partum Depression Score:5 Date of last PAP: 04/22/2020  no abnormalities   Review of Systems: Review of Systems  Constitutional: Negative.   Gastrointestinal: Negative.   Genitourinary: Negative.   Psychiatric/Behavioral: Negative.     The following portions of the patient's history were reviewed and updated as appropriate: allergies, current medications, past family history, past medical history, past social history, past surgical history, and problem list.  Past Medical History:  Past Medical History:  Diagnosis Date   Abnormal thyroid blood test 07/28/2015   GAD (generalized anxiety disorder) 07/20/2015   Gestational diabetes    Insomnia 07/20/2015    Past Surgical History:  Past Surgical History:  Procedure Laterality Date   NM RENAL LASIX ([facility] HX)     TONSILLECTOMY AND ADENOIDECTOMY     WISDOM TOOTH EXTRACTION      Family History:  Family History  Problem Relation Age of Onset   Ovarian cysts Mother    Diabetes Maternal Grandmother    Hypertension Maternal Grandmother     Social History:  Social History   Socioeconomic History   Marital status: Married    Spouse name: Shawn    Number of children: 0   Years of  education: Not on file   Highest education level: Bachelor's degree (e.g., BA, AB, BS)  Occupational History   Not on file  Tobacco Use   Smoking status: Never   Smokeless tobacco: Never  Vaping Use   Vaping Use: Never used  Substance and Sexual Activity   Alcohol use: No   Drug use: No   Sexual activity: Yes    Partners: Male    Birth control/protection: None  Other Topics Concern   Not on file  Social History Narrative   Not on file   Social Determinants of Health   Financial Resource Strain: Not on file  Food Insecurity: Not on file  Transportation Needs: Not on file  Physical Activity: Not on file  Stress: Not on file  Social Connections: Not on file  Intimate Partner Violence: Not on file    Allergies:  No Known Allergies  Medications: Prior to Admission medications   Medication Sig Start Date End Date Taking? Authorizing Provider  lidocaine-prilocaine (EMLA) cream Apply 1 application topically as needed. 12/26/20   Vena Austria, MD  Prenatal Vit-Fe Fumarate-FA (MULTIVITAMIN-PRENATAL) 27-0.8 MG TABS tablet Take 1 tablet by mouth daily at 12 noon.    [provider]  sertraline (ZOLOFT) 50 MG tablet Take 1 tablet (50 mg total) by mouth daily. 12/26/20   Vena Austria, MD  traZODone (DESYREL) 50 MG tablet Take 1 tablet (50 mg total) by mouth at bedtime as needed for sleep. 01/03/21   Vena Austria, MD    Physical  Exam Blood pressure 110/66, weight 148 lb (67.1 kg), currently breastfeeding.    General: NAD HEENT: normocephalic, anicteric Pulmonary: No increased work of breathing Abdomen: NABS, soft, non-tender, non-distended.  Umbilicus without lesions.  No hepatomegaly, splenomegaly or masses palpable. No evidence of hernia. Genitourinary:  External: Normal external female genitalia.  Normal urethral meatus, normal  Bartholin's and Skene's glands.    Vagina: Normal vaginal mucosa, no evidence of prolapse.    Cervix: Grossly normal in  appearance, no bleeding  Uterus: Non-enlarged, mobile, normal contour.  No CMT  Adnexa: ovaries non-enlarged, no adnexal masses  Rectal: deferred Extremities: no edema, erythema, or tenderness Neurologic: Grossly intact Psychiatric: mood appropriate, affect full   Edinburgh Postnatal Depression Scale - 02/03/21 1436       Edinburgh Postnatal Depression Scale:  In the Past 7 Days   I have been able to laugh and see the funny side of things. 0    I have looked forward with enjoyment to things. 0    I have blamed myself unnecessarily when things went wrong. 1    I have been anxious or worried for no good reason. 2    I have felt scared or panicky for no good reason. 1    Things have been getting on top of me. 1    I have been so unhappy that I have had difficulty sleeping. 0    I have felt sad or miserable. 0    I have been so unhappy that I have been crying. 0    The thought of harming myself has occurred to me. 0    Edinburgh Postnatal Depression Scale Total 5             Assessment: 30 y.o. G1P1001 presenting for 6 week postpartum visit  Plan: Problem List Items Addressed This Visit   None Visit Diagnoses     6 weeks postpartum follow-up    -  Primary        1) Contraception - Education given regarding options for contraception, as well as compatibility with breast feeding if applicable.  Patient plans on oral progesterone-only contraceptive for contraception.  2)  Pap - ASCCP guidelines and rational discussed.  ASCCP guidelines and rational discussed.  Patient opts for every 3 years screening interval  3) Patient underwent screening for postpartum depression with no signs of depression - continue current dose of Zoloft  4) Return in about 1 year (around 02/03/2022) for annual.   Vena Austria, MD, [facility], [facility] 02/03/2021, 3:06 PM

## 2021-04-13 ENCOUNTER — Other Ambulatory Visit: Payer: Self-pay | Admitting: Obstetrics and Gynecology

## 2021-04-13 MED ORDER — TRAZODONE HCL 50 MG PO TABS: 50.0000 mg | ORAL_TABLET | Freq: Every evening | ORAL | 2 refills | Status: DC | PRN

## 2021-04-18 ENCOUNTER — Other Ambulatory Visit: Payer: Self-pay | Admitting: Obstetrics and Gynecology

## 2021-04-18 MED ORDER — METOCLOPRAMIDE HCL 10 MG PO TABS: 10.0000 mg | ORAL_TABLET | Freq: Three times a day (TID) | ORAL | 2 refills | Status: DC

## 2021-05-09 ENCOUNTER — Encounter: Payer: Self-pay | Admitting: Obstetrics and Gynecology

## 2021-07-13 ENCOUNTER — Encounter: Payer: Self-pay | Admitting: Obstetrics & Gynecology

## 2021-07-13 ENCOUNTER — Other Ambulatory Visit: Payer: Self-pay | Admitting: Obstetrics & Gynecology

## 2021-07-13 MED ORDER — TRAZODONE HCL 50 MG PO TABS: 50.0000 mg | ORAL_TABLET | Freq: Every evening | ORAL | 0 refills | Status: DC | PRN

## 2021-08-17 ENCOUNTER — Encounter: Payer: Self-pay | Admitting: Family Medicine

## 2021-09-26 ENCOUNTER — Ambulatory Visit: Payer: BC Managed Care – PPO | Admitting: Family Medicine

## 2021-12-07 IMAGING — US US EXTREM LOW VENOUS*L*
1 series · 13 of 24 positions shown · non-contrast
Comparison: None.

CLINICAL DATA: Left lower extremity pain and edema. Patient is
currently pregnant. Evaluate for DVT.



[Series 1: us venous img lower uni left (dvt) · portal-venous · 13 of 35 slices shown]
[im 1/35]
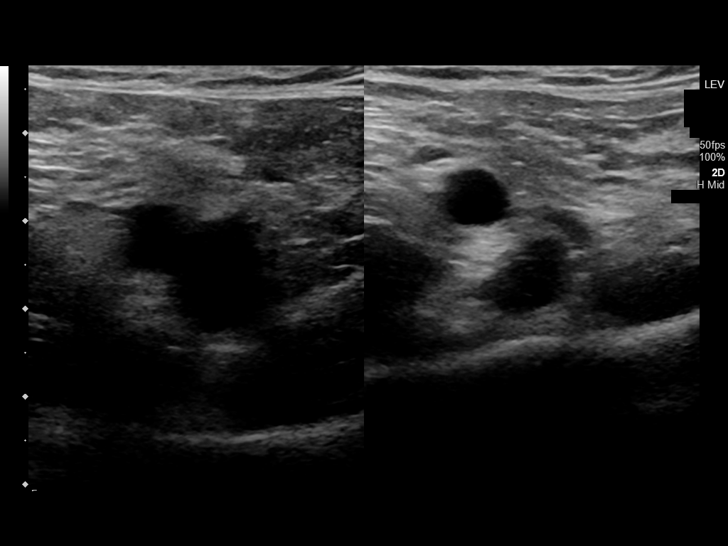
[im 3/35]
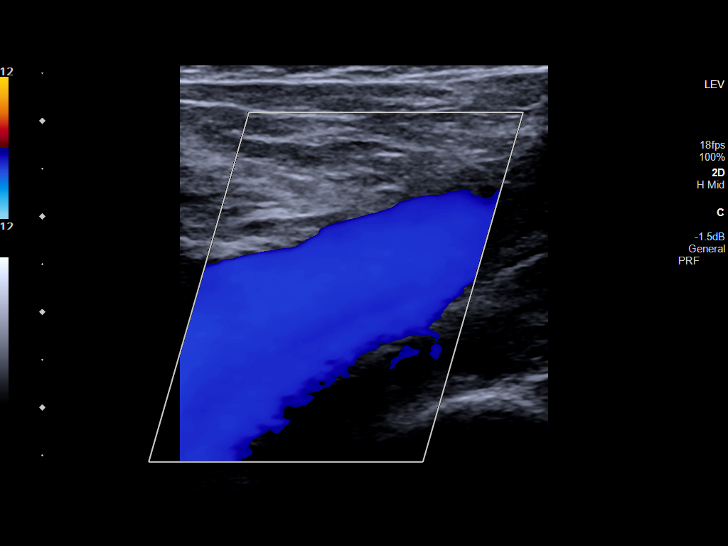
[im 6/35]
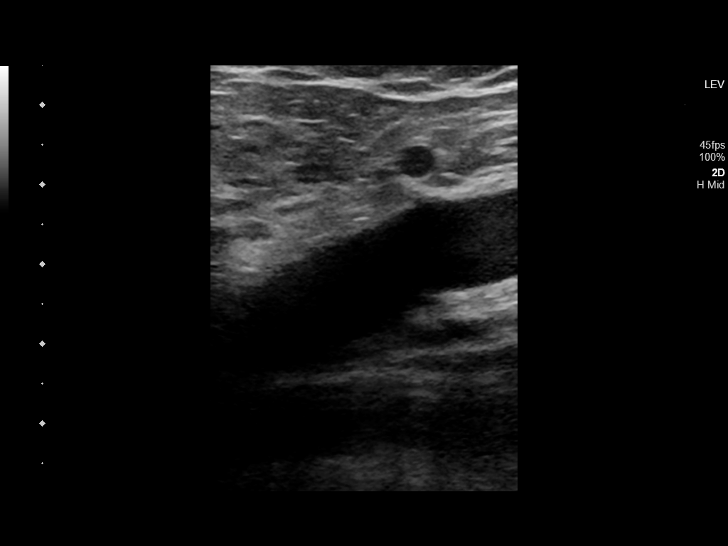
[im 9/35]
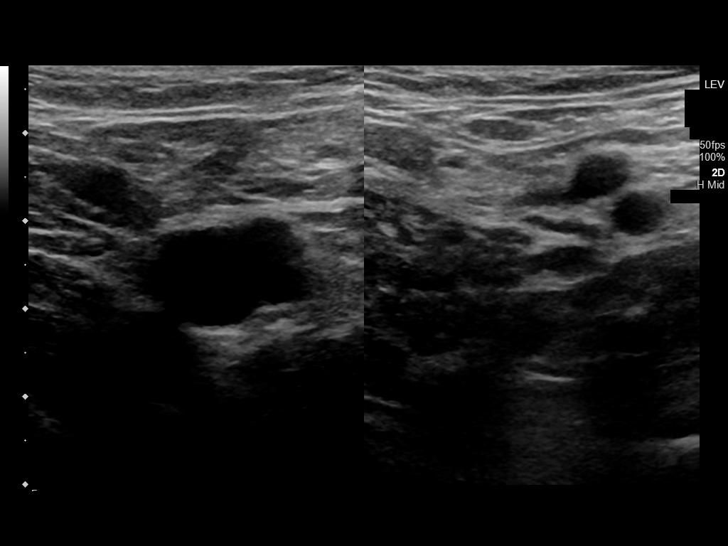
[im 12/35]
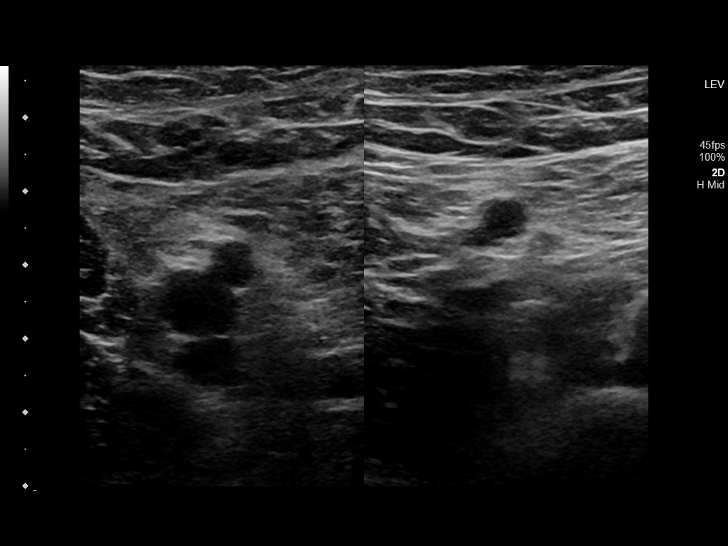
[im 15/35]
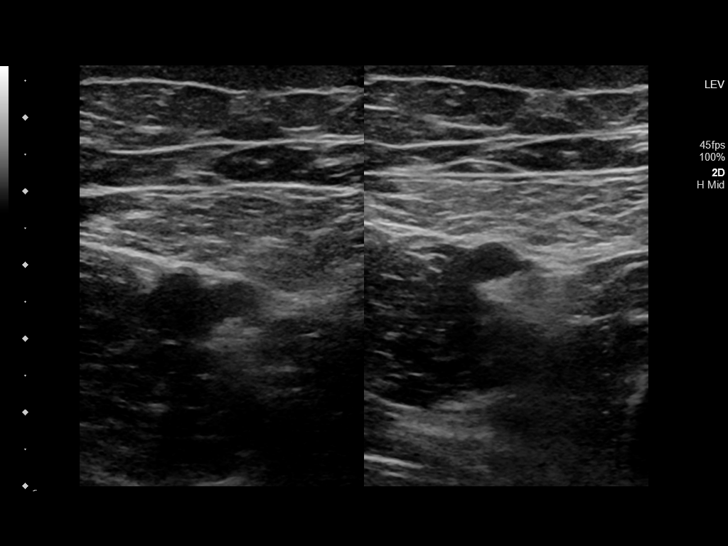
[im 18/35]
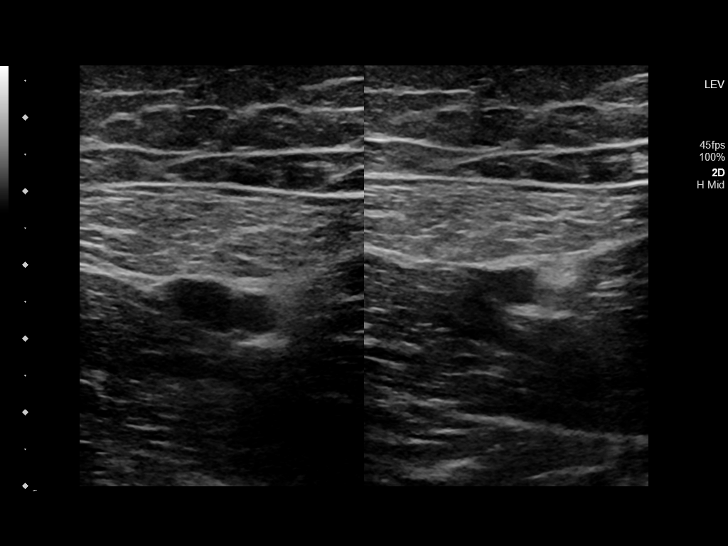
[im 20/35]
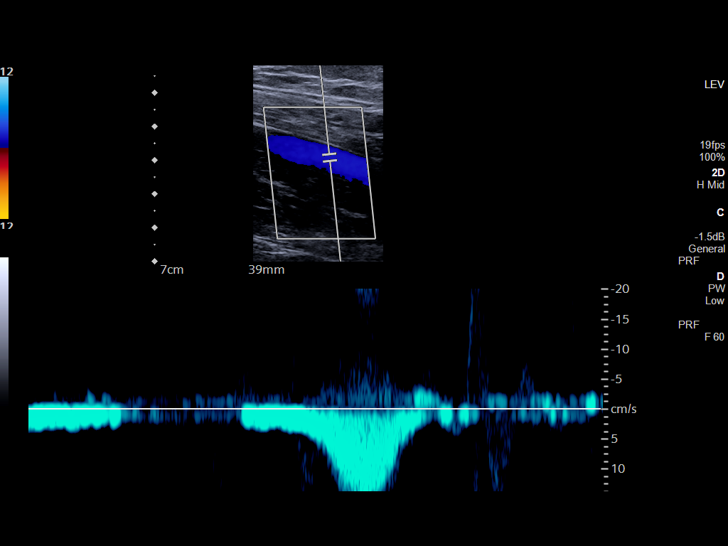
[im 23/35]
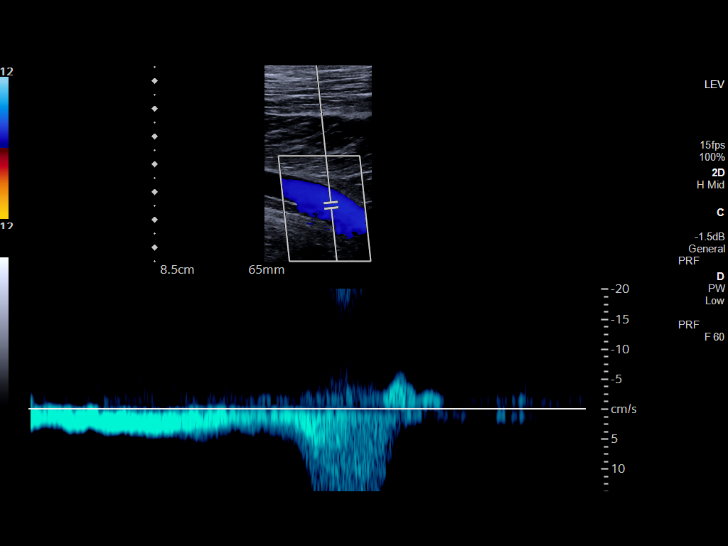
[im 26/35]
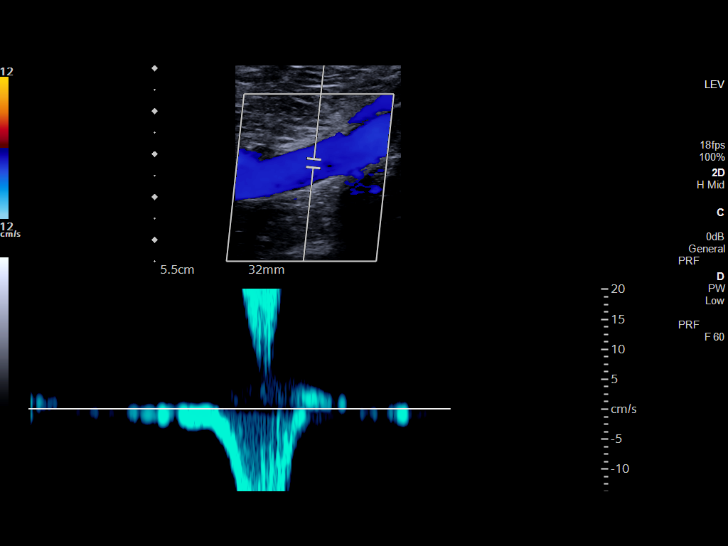
[im 29/35]
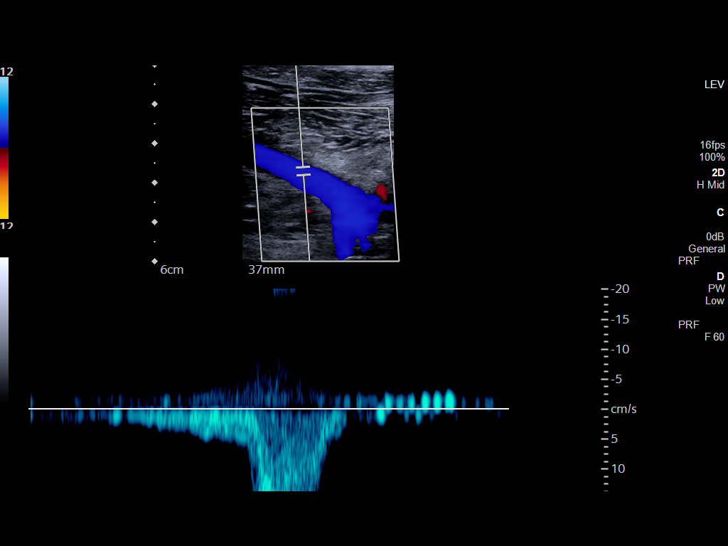
[im 32/35]
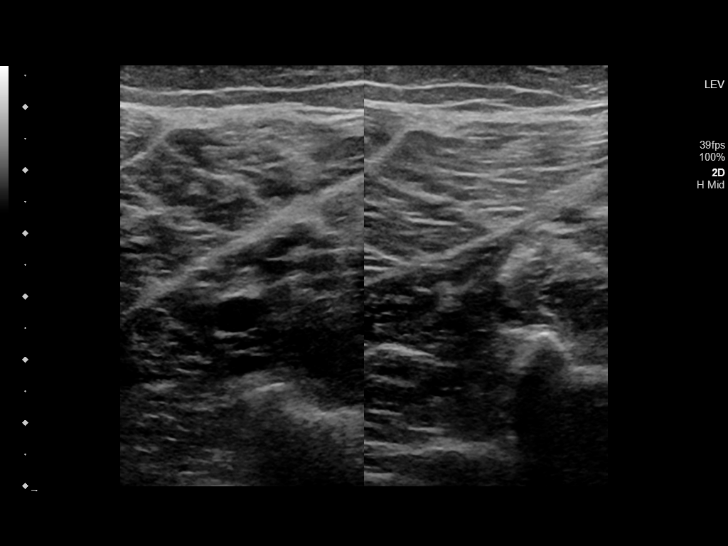
[im 35/35]
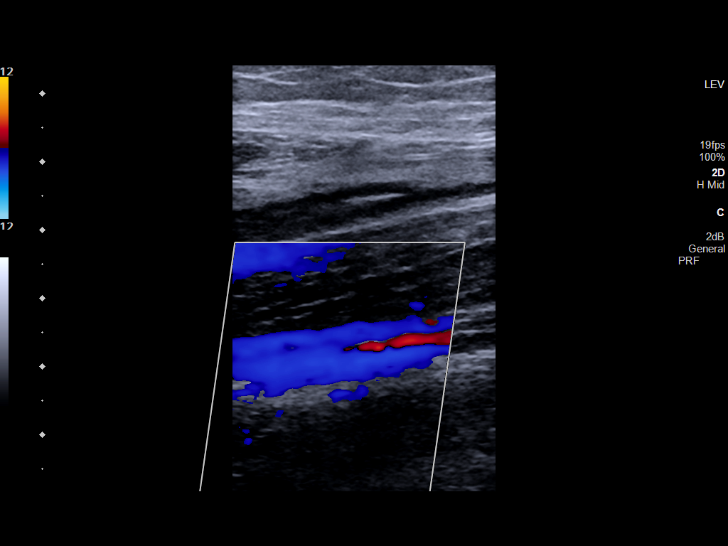

[13 of 24 positions shown; findings below may reference images not displayed]

FINDINGS: Contralateral Common Femoral Vein: Respiratory phasicity is normal
and symmetric with the symptomatic side. No evidence of thrombus.
Normal compressibility.

Common Femoral Vein: No evidence of thrombus. Normal
compressibility, respiratory phasicity and response to augmentation.

Saphenofemoral Junction: No evidence of thrombus. Normal
compressibility and flow on color Doppler imaging.

Profunda Femoral Vein: No evidence of thrombus. Normal
compressibility and flow on color Doppler imaging.

Femoral Vein: No evidence of thrombus. Normal compressibility,
respiratory phasicity and response to augmentation.

Popliteal Vein: No evidence of thrombus. Normal compressibility,
respiratory phasicity and response to augmentation.

Calf Veins: No evidence of thrombus. Normal compressibility and flow
on color Doppler imaging.

Superficial Great Saphenous Vein: No evidence of thrombus. Normal
compressibility.

Venous Reflux:  None.

Other Findings:  None.
IMPRESSION: No evidence of DVT within the left lower extremity.

## 2022-06-06 NOTE — Patient Instructions (Incomplete)

## 2022-06-07 ENCOUNTER — Encounter: Payer: Self-pay | Admitting: Family Medicine

## 2022-06-07 ENCOUNTER — Ambulatory Visit (INDEPENDENT_AMBULATORY_CARE_PROVIDER_SITE_OTHER): Payer: BC Managed Care – PPO | Admitting: Family Medicine

## 2022-06-07 VITALS — BP 120/64 | HR 100 | Temp 97.6°F | Resp 16 | Ht 66.5 in | Wt 135.7 lb

## 2022-06-07 DIAGNOSIS — Z23 Encounter for immunization: Secondary | ICD-10-CM

## 2022-06-07 DIAGNOSIS — Z1159 Encounter for screening for other viral diseases: Secondary | ICD-10-CM

## 2022-06-07 DIAGNOSIS — G4700 Insomnia, unspecified: Secondary | ICD-10-CM | POA: Diagnosis not present

## 2022-06-07 DIAGNOSIS — Z0000 Encounter for general adult medical examination without abnormal findings: Secondary | ICD-10-CM

## 2022-06-07 DIAGNOSIS — F411 Generalized anxiety disorder: Secondary | ICD-10-CM | POA: Diagnosis not present

## 2022-06-07 MED ORDER — BUSPIRONE HCL 5 MG PO TABS: 5.0000 mg | ORAL_TABLET | Freq: Two times a day (BID) | ORAL | 2 refills | Status: DC | PRN

## 2022-06-07 MED ORDER — TRAZODONE HCL 50 MG PO TABS: 50.0000 mg | ORAL_TABLET | Freq: Every evening | ORAL | 3 refills | Status: DC | PRN

## 2022-06-07 MED ORDER — SERTRALINE HCL 50 MG PO TABS: 50.0000 mg | ORAL_TABLET | Freq: Every day | ORAL | 3 refills | Status: DC

## 2022-06-07 NOTE — Progress Notes (Signed)
Patient ID: Haley Bell, female    DOB: 14-Jul-1990, 31 y.o.   MRN: [id number]  PCP: Danelle Berry, PA-C  Chief Complaint  Patient presents with   Anxiety    Pt states it has got worst    Subjective:   Haley Bell is a 31 y.o. female, presents to clinic with CC of the following:  HPI  Here for Anxiety/reestablish care/get meds refilled Last seen in office Oct 2020 Hx of anxiety and insomnia - has tried buspar, zoloft and trazodone before      06/07/2022    8:51 AM 10/18/2020    8:33 AM 09/10/2019    3:26 PM  Depression screen PHQ 2/9  Decreased Interest 2 0 0  Down, Depressed, Hopeless 1 0 0  PHQ - 2 Score 3 0 0  Altered sleeping 3  0  Tired, decreased energy 2  0  Change in appetite 0  0  Feeling bad or failure about yourself  0  0  Trouble concentrating 0  0  Moving slowly or fidgety/restless 0  0  Suicidal thoughts 0  0  PHQ-9 Score 8  0  Difficult doing work/chores Somewhat difficult  Not difficult at all      06/07/2022    9:05 AM 07/20/2019    3:37 PM 09/18/2018   12:54 PM  GAD 7 : Generalized Anxiety Score  Nervous, Anxious, on Edge 3 1 2   Control/stop worrying 2 0 0  Worry too much - different things 2 1 0  Trouble relaxing 2 1 2   Restless 1 0 0  Easily annoyed or irritable 1 1 0  Afraid - awful might happen 1 0 0  Total GAD 7 Score 12 4 4   Anxiety Difficulty Very difficult Not difficult at all Not difficult at all   She was getting care from [facility] med list shows zoloft 50 mg, trazodone 50 mg Her OB left the practice in the last year She got one refill earlier this year of trazodone but has been off of zoloft and she feels like she is going to need it again She notes anxiety, excessive worrisome thoughts - her mind runs and wont stop, particularly at bedtime, her body is exhausted but she cannot sleep, melatonin doesn't help, trazodone did help in the past  She would like to restart zoloft   Patient Active Problem List   Diagnosis Date  Noted   Postpartum care following vaginal delivery 12/10/2020   Encounter for care or examination of lactating mother 12/10/2020   Vacuum-assisted vaginal delivery    Diet controlled gestational diabetes mellitus (GDM) in third trimester 10/24/2020   Elevated glucose tolerance test 10/05/2020   Supervision of high risk pregnancy, antepartum 04/22/2020   Fatigue 02/21/2016   Abnormal thyroid blood test 07/28/2015   GAD (generalized anxiety disorder) 07/20/2015   Insomnia 07/20/2015      Current Outpatient Medications:    lidocaine-prilocaine (EMLA) cream, Apply 1 application topically as needed., Disp: 30 g, Rfl: 0   metoCLOPramide (REGLAN) 10 MG tablet, Take 1 tablet (10 mg total) by mouth 3 (three) times daily before meals., Disp: 90 tablet, Rfl: 2   norethindrone (MICRONOR) 0.35 MG tablet, Take 1 tablet (0.35 mg total) by mouth daily., Disp: 28 tablet, Rfl: 11   Prenatal Vit-Fe Fumarate-FA (MULTIVITAMIN-PRENATAL) 27-0.8 MG TABS tablet, Take 1 tablet by mouth daily at 12 noon., Disp: , Rfl:    sertraline (ZOLOFT) 50 MG tablet, Take 1 tablet (50 mg total) by  mouth daily., Disp: 30 tablet, Rfl: 11   traZODone (DESYREL) 50 MG tablet, Take 1 tablet (50 mg total) by mouth at bedtime as needed for sleep., Disp: 30 tablet, Rfl: 0   No Known Allergies   Social History   Tobacco Use   Smoking status: Never   Smokeless tobacco: Never  Vaping Use   Vaping Use: Never used  Substance Use Topics   Alcohol use: No   Drug use: No      Chart Review Today: I personally reviewed active problem list, medication list, allergies, family history, social history, health maintenance, notes from last encounter, lab results, imaging with the patient/caregiver today.   Review of Systems  Constitutional: Negative.   HENT: Negative.    Eyes: Negative.   Respiratory: Negative.    Cardiovascular: Negative.   Gastrointestinal: Negative.   Endocrine: Negative.   Genitourinary: Negative.    Musculoskeletal: Negative.   Skin: Negative.   Allergic/Immunologic: Negative.   Neurological: Negative.   Hematological: Negative.   Psychiatric/Behavioral:  Positive for sleep disturbance. Negative for suicidal ideas. The patient is nervous/anxious.   All other systems reviewed and are negative.      Objective:   Vitals:   06/07/22 0902  BP: 120/64  Pulse: 100  Resp: 16  Temp: 97.6 F (36.4 C)  TempSrc: Oral  SpO2: 99%  Weight: 135 lb 11.2 oz (61.6 kg)  Height: 5' 6.5" (1.689 m)    Body mass index is 21.57 kg/m.  Physical Exam Vitals and nursing note reviewed.  Constitutional:      General: She is not in acute distress.    Appearance: Normal appearance. She is well-developed and normal weight. She is not ill-appearing, toxic-appearing or diaphoretic.  HENT:     Head: Normocephalic and atraumatic.     Right Ear: External ear normal.     Left Ear: External ear normal.     Nose: Nose normal.  Eyes:     General: No scleral icterus.       Right eye: No discharge.        Left eye: No discharge.     Conjunctiva/sclera: Conjunctivae normal.  Neck:     Trachea: No tracheal deviation.  Cardiovascular:     Rate and Rhythm: Normal rate and regular rhythm.  Pulmonary:     Effort: Pulmonary effort is normal. No respiratory distress.     Breath sounds: No stridor.  Musculoskeletal:        General: Normal range of motion.  Skin:    General: Skin is warm and dry.     Findings: No rash.  Neurological:     Mental Status: She is alert. Mental status is at baseline.     Motor: No abnormal muscle tone.     Coordination: Coordination normal.  Psychiatric:        Attention and Perception: Attention normal.        Mood and Affect: Mood and affect normal.        Speech: Speech normal.        Behavior: Behavior normal. Behavior is cooperative.        Thought Content: Thought content normal.     Results for orders placed or performed during the hospital encounter of 12/09/20   Resp Panel by RT-PCR (Flu A&B, Covid) Nasopharyngeal Swab   Specimen: Nasopharyngeal Swab; Nasopharyngeal(NP) swabs in vial transport medium  Result Value Ref Range   SARS Coronavirus 2 by RT PCR NEGATIVE NEGATIVE   Influenza A by PCR  NEGATIVE NEGATIVE   Influenza B by PCR NEGATIVE NEGATIVE  ROM Plus ([facility] only)  Result Value Ref Range   Rom Plus POSITIVE   CBC  Result Value Ref Range   WBC 9.6 4.0 - 10.5 K/uL   RBC 3.89 3.87 - 5.11 MIL/uL   Hemoglobin 13.8 12.0 - 15.0 g/dL   HCT 82.939.3 56.236.0 - 13.046.0 %   MCV 101.0 (H) 80.0 - 100.0 fL   MCH 35.5 (H) 26.0 - 34.0 pg   MCHC 35.1 30.0 - 36.0 g/dL   RDW 86.512.1 78.411.5 - 69.615.5 %   Platelets 121 (L) 150 - 400 K/uL   nRBC 0.0 0.0 - 0.2 %  RPR  Result Value Ref Range   RPR Ser Ql NON REACTIVE NON REACTIVE  Glucose, capillary  Result Value Ref Range   Glucose-Capillary 107 (H) 70 - 99 mg/dL   Comment 1 Document in Chart   OB RESULTS CONSOLE GC/Chlamydia  Result Value Ref Range   Gonorrhea Negative   OB RESULTS CONSOLE Varicella zoster antibody, IgG  Result Value Ref Range   Varicella Immune   CBC  Result Value Ref Range   WBC 11.5 (H) 4.0 - 10.5 K/uL   RBC 3.01 (L) 3.87 - 5.11 MIL/uL   Hemoglobin 11.0 (L) 12.0 - 15.0 g/dL   HCT 29.531.4 (L) 28.436.0 - 13.246.0 %   MCV 104.3 (H) 80.0 - 100.0 fL   MCH 36.5 (H) 26.0 - 34.0 pg   MCHC 35.0 30.0 - 36.0 g/dL   RDW 44.012.5 10.211.5 - 72.515.5 %   Platelets 95 (L) 150 - 400 K/uL   nRBC 0.0 0.0 - 0.2 %  Type and screen [facility]  Result Value Ref Range   ABO/RH(D) O POS    Antibody Screen NEG    Sample Expiration      12/12/2020,2359 Performed at [facility], [address] [place], [place] [address]   ABO/Rh  Result Value Ref Range   ABO/RH(D)      O POS Performed at [facility], [address] [place], [place] [address]        Assessment & Plan:    1. GAD (generalized anxiety disorder) Pt was managing with OBGYN through pregnancy and in postpartum  period She went off meds at the start of her pregnancy, then anxiety got severe just after delivery (about 18 months ago) the OBGYN restarted meds and they worked well for a while.  She eventually got off/ran out of meds again earlier this year and most of the Gyn providers had left the practice She currently feels like she needs to restart med with worse anxiety sx and insomnia Restart zoloft - start 25 mg (1/2 tab) for the first 1-2 weeks and then increase to 50 (what she was taking before) we will reevaluate in 2 months - if sx are still not controlled I explained we can adjust the dose Buspar she's tried in the past was effective, can use that now while zoloft takes some time to be effective Recommended est with psych/therapy for additional tx/tools to manage her sx - several clinics and resources given to pt today  - sertraline (ZOLOFT) 50 MG tablet; Take 1 tablet (50 mg total) by mouth daily.  Dispense: 90 tablet; Refill: 3 - busPIRone (BUSPAR) 5 MG tablet; Take 1-2 tablets (5-10 mg total) by mouth 2 (two) times daily as needed (anxiety).  Dispense: 180 tablet; Refill: 2  2. Insomnia, unspecified type Can try buspar,  sleep hygiene, trazodone worked well for her in the past - traZODone (DESYREL) 50 MG tablet; Take 1-2 tablets (50-100 mg total) by mouth at bedtime as needed for sleep.  Dispense: 90 tablet; Refill: 3    F/up in 6-8 weeks to recheck med effectiveness/mood/SE etc - can be virtual   Danelle Berry, PA-C 06/07/22 9:12 AM

## 2022-08-07 NOTE — Progress Notes (Signed)
Name: Haley Bell   MRN: [id number]    DOB: [birth year]   Date:08/07/2022       Progress Note  Subjective  Chief Complaint  No chief complaint on file.   I connected with  Steffanie Dunn  on 08/07/22 at  8:00 AM EST by a video enabled telemedicine application and verified that I am speaking with the correct person using two identifiers.  I discussed the limitations of evaluation and management by telemedicine and the availability of in person appointments. The patient expressed understanding and agreed to proceed with the virtual visit  Staff also discussed with the patient that there may be a patient responsible charge related to this service. Patient Location: *** Provider Location: *** Additional Individuals present: ***  HPI  Anxiety/insomnia: Patient saw Delsa Grana PA on 06/07/2022 for anxiety and insomnia.  Patient was being managed by OB/GYN through pregnancy and postpartum.Marland Kitchen  She went off her medications and at the start of her pregnancy and her exam was not severe just after delivery which is about 20 months ago.  OB/GYN had restarted her meds and they worked well for a while patient eventually got off/ran out of meds again earlier in 2023.  Most of the GYN providers had left the practice that she was getting [place].  Patient was restarted on Zoloft she was to start 25 mg for the first 1 to 2 weeks and then increase to 50 mg which was what she was taking before.  She was also prescribed BuSpar 5 mg to take 1 to 2 tablets 2 times a day as needed for her anxiety.  Patient was also prescribed trazodone 50 mg and to take 1 to 2 tablets at bedtime to help with sleep.  Patient reports ***.     06/07/2022    8:51 AM 10/18/2020    8:33 AM 09/10/2019    3:26 PM 07/20/2019    3:37 PM 04/21/2019    4:02 PM  Depression screen PHQ 2/9  Decreased Interest 2 0 0 0 0  Down, Depressed, Hopeless 1 0 0 0 0  PHQ - 2 Score 3 0 0 0 0  Altered sleeping 3  0 1 0  Tired, decreased energy 2  0 0 0   Change in appetite 0  0 0 0  Feeling bad or failure about yourself  0  0 0 0  Trouble concentrating 0  0 0 0  Moving slowly or fidgety/restless 0  0 0 0  Suicidal thoughts 0  0 0 0  PHQ-9 Score 8  0 1 0  Difficult doing work/chores Somewhat difficult  Not difficult at all Not difficult at all Not difficult at all       06/07/2022    9:05 AM 07/20/2019    3:37 PM 09/18/2018   12:54 PM  GAD 7 : Generalized Anxiety Score  Nervous, Anxious, on Edge 3 1 2  $ Control/stop worrying 2 0 0  Worry too much - different things 2 1 0  Trouble relaxing 2 1 2  $ Restless 1 0 0  Easily annoyed or irritable 1 1 0  Afraid - awful might happen 1 0 0  Total GAD 7 Score 12 4 4  $ Anxiety Difficulty Very difficult Not difficult at all Not difficult at all     Patient Active Problem List   Diagnosis Date Noted   Postpartum care following vaginal delivery 12/10/2020   Encounter for care or examination of lactating mother 12/10/2020  Vacuum-assisted vaginal delivery    Diet controlled gestational diabetes mellitus (GDM) in third trimester 10/24/2020   Elevated glucose tolerance test 10/05/2020   Supervision of high risk pregnancy, antepartum 04/22/2020   Fatigue 02/21/2016   Abnormal thyroid blood test 07/28/2015   GAD (generalized anxiety disorder) 07/20/2015   Insomnia 07/20/2015    Past Surgical History:  Procedure Laterality Date   NM RENAL LASIX ([facility] HX)     TONSILLECTOMY AND ADENOIDECTOMY     WISDOM TOOTH EXTRACTION      Family History  Problem Relation Age of Onset   Ovarian cysts Mother    Diabetes Maternal Grandmother    Hypertension Maternal Grandmother     Social History   Socioeconomic History   Marital status: Married    Spouse name: Shawn    Number of children: 0   Years of education: Not on file   Highest education level: Bachelor's degree (e.g., BA, AB, BS)  Occupational History   Not on file  Tobacco Use   Smoking status: Never   Smokeless tobacco: Never  Vaping  Use   Vaping Use: Never used  Substance and Sexual Activity   Alcohol use: No   Drug use: No   Sexual activity: Yes    Partners: Male    Birth control/protection: None  Other Topics Concern   Not on file  Social History Narrative   Not on file   Social Determinants of Health   Financial Resource Strain: Low Risk  (04/18/2018)   Overall Financial Resource Strain (CARDIA)    Difficulty of Paying Living Expenses: Not hard at all  Food Insecurity: No Food Insecurity (04/18/2018)   Hunger Vital Sign    Worried About Running Out of Food in the Last Year: Never true    Ran Out of Food in the Last Year: Never true  Transportation Needs: No Transportation Needs (04/18/2018)   PRAPARE - Hydrologist (Medical): No    Lack of Transportation (Non-Medical): No  Physical Activity: Sufficiently Active (04/21/2019)   Exercise Vital Sign    Days of Exercise per Week: 4 days    Minutes of Exercise per Session: 60 min  Stress: Stress Concern Present (04/18/2018)   [place]    Feeling of Stress : To some extent  Social Connections: Socially Integrated (04/18/2018)   Social Connection and Isolation Panel [NHANES]    Frequency of Communication with Friends and Family: More than three times a week    Frequency of Social Gatherings with Friends and Family: More than three times a week    Attends Religious Services: More than 4 times per year    Active Member of [employer] or Organizations: Yes    Attends Music therapist: More than 4 times per year    Marital Status: Married  Human resources officer Violence: Not At Risk (04/18/2018)   Humiliation, Afraid, Rape, and Kick questionnaire    Fear of Current or Ex-Partner: No    Emotionally Abused: No    Physically Abused: No    Sexually Abused: No     Current Outpatient Medications:    busPIRone (BUSPAR) 5 MG tablet, Take 1-2 tablets (5-10 mg total) by  mouth 2 (two) times daily as needed (anxiety)., Disp: 180 tablet, Rfl: 2   lidocaine-prilocaine (EMLA) cream, Apply 1 application topically as needed., Disp: 30 g, Rfl: 0   metoCLOPramide (REGLAN) 10 MG tablet, Take 1 tablet (10 mg total) by  mouth 3 (three) times daily before meals., Disp: 90 tablet, Rfl: 2   norethindrone (MICRONOR) 0.35 MG tablet, Take 1 tablet (0.35 mg total) by mouth daily., Disp: 28 tablet, Rfl: 11   Prenatal Vit-Fe Fumarate-FA (MULTIVITAMIN-PRENATAL) 27-0.8 MG TABS tablet, Take 1 tablet by mouth daily at 12 noon., Disp: , Rfl:    sertraline (ZOLOFT) 50 MG tablet, Take 1 tablet (50 mg total) by mouth daily., Disp: 90 tablet, Rfl: 3   traZODone (DESYREL) 50 MG tablet, Take 1-2 tablets (50-100 mg total) by mouth at bedtime as needed for sleep., Disp: 90 tablet, Rfl: 3  No Known Allergies  I personally reviewed active problem list, medication list, allergies, notes from last encounter with the patient/caregiver today.   ROS Constitutional: Negative for fever or weight change.  Respiratory: Negative for cough and shortness of breath.   Cardiovascular: Negative for chest pain or palpitations.  Gastrointestinal: Negative for abdominal pain, no bowel changes.  Musculoskeletal: Negative for gait problem or joint swelling.  Skin: Negative for rash.  Neurological: Negative for dizziness or headache.  No other specific complaints in a complete review of systems (except as listed in HPI above).   Objective  Virtual encounter, vitals not obtained.  There is no height or weight on file to calculate BMI.  Physical Exam Awake, alert and oriented, speaking in complete sentences  No results found for this or any previous visit (from the past 72 hour(s)).   Fall Risk:    06/07/2022    8:50 AM 10/31/2020    3:03 PM 10/18/2020    8:33 AM 09/10/2019    3:26 PM 07/20/2019    3:37 PM  [place] in the past year? 0 0 0 0 0  Number falls in past yr: 0   0 0  Injury with  Fall? 0   0 0  Risk for fall due to : No Fall Risks      Follow up Falls prevention discussed;Education provided;Falls evaluation completed         Assessment & Plan  There are no diagnoses linked to this encounter.  I discussed the assessment and treatment plan with the patient. The patient was provided an opportunity to ask questions and all were answered. The patient agreed with the plan and demonstrated an understanding of the instructions.  The patient was advised to call back or seek an in-person evaluation if the symptoms worsen or if the condition fails to improve as anticipated.  I provided *** minutes of non-face-to-face time during this encounter.

## 2022-08-08 ENCOUNTER — Telehealth: Payer: BC Managed Care – PPO | Admitting: Nurse Practitioner

## 2022-08-08 DIAGNOSIS — F411 Generalized anxiety disorder: Secondary | ICD-10-CM

## 2022-08-08 DIAGNOSIS — G4700 Insomnia, unspecified: Secondary | ICD-10-CM

## 2022-10-18 ENCOUNTER — Ambulatory Visit: Payer: Self-pay | Admitting: Internal Medicine

## 2022-10-24 ENCOUNTER — Encounter: Payer: Self-pay | Admitting: Family Medicine

## 2022-10-24 ENCOUNTER — Ambulatory Visit: Payer: BC Managed Care – PPO | Admitting: Family Medicine

## 2022-10-24 VITALS — BP 98/62 | HR 77 | Temp 98.1°F | Resp 16 | Ht 66.5 in | Wt 133.0 lb

## 2022-10-24 DIAGNOSIS — R221 Localized swelling, mass and lump, neck: Secondary | ICD-10-CM

## 2022-10-24 NOTE — Progress Notes (Signed)
Patient ID: GRAVIELA SHUBIN, female    DOB: 1991/05/14, 32 y.o.   MRN: [id number]  PCP: Danelle Berry, PA-C  Chief Complaint  Patient presents with   Mass    On left side of neck noticed it Past Thursday evening, hurts to touch and move neck around.    Subjective:   STARKESHA DARE is a 32 y.o. female, presents to clinic with CC of the following:  HPI  Patient presents with a bump on the left side of her neck just under her jaw very tender to the touch She has not had sore throat, nasal drainage, difficulty swallowing speaking or breathing No recent fever sweats or chills    Patient Active Problem List   Diagnosis Date Noted   Postpartum care following vaginal delivery 12/10/2020   Encounter for care or examination of lactating mother 12/10/2020   Vacuum-assisted vaginal delivery    Diet controlled gestational diabetes mellitus (GDM) in third trimester 10/24/2020   Elevated glucose tolerance test 10/05/2020   Supervision of high risk pregnancy, antepartum 04/22/2020   Fatigue 02/21/2016   Abnormal thyroid blood test 07/28/2015   GAD (generalized anxiety disorder) 07/20/2015   Insomnia 07/20/2015      Current Outpatient Medications:    busPIRone (BUSPAR) 5 MG tablet, Take 1-2 tablets (5-10 mg total) by mouth 2 (two) times daily as needed (anxiety)., Disp: 180 tablet, Rfl: 2   sertraline (ZOLOFT) 50 MG tablet, Take 1 tablet (50 mg total) by mouth daily., Disp: 90 tablet, Rfl: 3   traZODone (DESYREL) 50 MG tablet, Take 1-2 tablets (50-100 mg total) by mouth at bedtime as needed for sleep., Disp: 90 tablet, Rfl: 3   lidocaine-prilocaine (EMLA) cream, Apply 1 application topically as needed. (Patient not taking: Reported on 10/24/2022), Disp: 30 g, Rfl: 0   metoCLOPramide (REGLAN) 10 MG tablet, Take 1 tablet (10 mg total) by mouth 3 (three) times daily before meals. (Patient not taking: Reported on 10/24/2022), Disp: 90 tablet, Rfl: 2   norethindrone (MICRONOR) 0.35 MG tablet,  Take 1 tablet (0.35 mg total) by mouth daily. (Patient not taking: Reported on 10/24/2022), Disp: 28 tablet, Rfl: 11   Prenatal Vit-Fe Fumarate-FA (MULTIVITAMIN-PRENATAL) 27-0.8 MG TABS tablet, Take 1 tablet by mouth daily at 12 noon. (Patient not taking: Reported on 10/24/2022), Disp: , Rfl:    No Known Allergies   Social History   Tobacco Use   Smoking status: Never   Smokeless tobacco: Never  Vaping Use   Vaping Use: Never used  Substance Use Topics   Alcohol use: No   Drug use: No      Chart Review Today: I personally reviewed active problem list, medication list, allergies, family history, social history, health maintenance, notes from last encounter, lab results, imaging with the patient/caregiver today.   Review of Systems  Constitutional: Negative.   HENT: Negative.    Eyes: Negative.   Respiratory: Negative.    Cardiovascular: Negative.   Gastrointestinal: Negative.   Endocrine: Negative.   Genitourinary: Negative.   Musculoskeletal: Negative.   Skin: Negative.   Allergic/Immunologic: Negative.   Neurological: Negative.   Hematological: Negative.   Psychiatric/Behavioral: Negative.    All other systems reviewed and are negative.      Objective:   Vitals:   10/24/22 1312  BP: 98/62  Pulse: 77  Resp: 16  Temp: 98.1 F (36.7 C)  TempSrc: Oral  SpO2: 98%  Weight: 133 lb (60.3 kg)  Height: 5' 6.5" (1.689 m)  Body mass index is 21.15 kg/m.  Physical Exam Vitals and nursing note reviewed.  Constitutional:      General: She is not in acute distress.    Appearance: Normal appearance. She is well-developed. She is not ill-appearing, toxic-appearing or diaphoretic.  HENT:     Head: Normocephalic and atraumatic.     Right Ear: Tympanic membrane, ear canal and external ear normal. There is no impacted cerumen.     Left Ear: Tympanic membrane, ear canal and external ear normal. There is no impacted cerumen.     Nose: Nose normal. No congestion or  rhinorrhea.     Mouth/Throat:     Mouth: Mucous membranes are moist.     Pharynx: Oropharynx is clear. No oropharyngeal exudate or posterior oropharyngeal erythema.  Eyes:     General:        Right eye: No discharge.        Left eye: No discharge.     Conjunctiva/sclera: Conjunctivae normal.  Neck:     Thyroid: No thyroid mass, thyromegaly or thyroid tenderness.     Trachea: Trachea and phonation normal. No tracheal deviation.  Cardiovascular:     Rate and Rhythm: Normal rate and regular rhythm.     Pulses: Normal pulses.     Heart sounds: Normal heart sounds.  Pulmonary:     Effort: Pulmonary effort is normal. No respiratory distress.     Breath sounds: Normal breath sounds. No stridor.  Musculoskeletal:        General: Normal range of motion.     Cervical back: Normal range of motion and neck supple. No rigidity. Normal range of motion.  Lymphadenopathy:     Head:     Right side of head: Submandibular adenopathy present.     Left side of head: No submental or submandibular adenopathy.     Cervical: No cervical adenopathy.     Comments: tender mass/lump to right neck under jawline, >1cm firm tender non-mobile   Skin:    General: Skin is warm and dry.     Findings: No rash.  Neurological:     Mental Status: She is alert.     Motor: No abnormal muscle tone.     Coordination: Coordination normal.  Psychiatric:        Behavior: Behavior normal.      Results for orders placed or performed during the hospital encounter of 12/09/20  Resp Panel by RT-PCR (Flu A&B, Covid) Nasopharyngeal Swab   Specimen: Nasopharyngeal Swab; Nasopharyngeal(NP) swabs in vial transport medium  Result Value Ref Range   SARS Coronavirus 2 by RT PCR NEGATIVE NEGATIVE   Influenza A by PCR NEGATIVE NEGATIVE   Influenza B by PCR NEGATIVE NEGATIVE  ROM Plus ([facility] only)  Result Value Ref Range   Rom Plus POSITIVE   CBC  Result Value Ref Range   WBC 9.6 4.0 - 10.5 K/uL   RBC 3.89 3.87 - 5.11 MIL/uL    Hemoglobin 13.8 12.0 - 15.0 g/dL   HCT 16.1 09.6 - 04.5 %   MCV 101.0 (H) 80.0 - 100.0 fL   MCH 35.5 (H) 26.0 - 34.0 pg   MCHC 35.1 30.0 - 36.0 g/dL   RDW 40.9 81.1 - 91.4 %   Platelets 121 (L) 150 - 400 K/uL   nRBC 0.0 0.0 - 0.2 %  RPR  Result Value Ref Range   RPR Ser Ql NON REACTIVE NON REACTIVE  Glucose, capillary  Result Value Ref Range   Glucose-Capillary 107 (  H) 70 - 99 mg/dL   Comment 1 Document in Chart   OB RESULTS CONSOLE GC/Chlamydia  Result Value Ref Range   Gonorrhea Negative   OB RESULTS CONSOLE Varicella zoster antibody, IgG  Result Value Ref Range   Varicella Immune   CBC  Result Value Ref Range   WBC 11.5 (H) 4.0 - 10.5 K/uL   RBC 3.01 (L) 3.87 - 5.11 MIL/uL   Hemoglobin 11.0 (L) 12.0 - 15.0 g/dL   HCT 16.1 (L) 09.6 - 04.5 %   MCV 104.3 (H) 80.0 - 100.0 fL   MCH 36.5 (H) 26.0 - 34.0 pg   MCHC 35.0 30.0 - 36.0 g/dL   RDW 40.9 81.1 - 91.4 %   Platelets 95 (L) 150 - 400 K/uL   nRBC 0.0 0.0 - 0.2 %  Type and screen [facility]  Result Value Ref Range   ABO/RH(D) O POS    Antibody Screen NEG    Sample Expiration      12/12/2020,2359 Performed at [facility], [address] [place], [place] [address]   ABO/Rh  Result Value Ref Range   ABO/RH(D)      O POS Performed at [facility], [address], [place], [place] [address]        Assessment & Plan:   1. Mass of right side of neck No recent URI sx, no fever, right submandibular area with rougly 1 cm non-mobile firm tender mass round - [place] to further assess Reactive lymph node vs salivary gland issue? Pt well appearing, no difficulty with speaking, breathing or swallowing, airway patent, no trismus  - US SOFT TISSUE HEAD & NECK (NON-THYROID)      Danelle Berry, PA-C 10/24/22 1:43 PM

## 2022-10-29 ENCOUNTER — Ambulatory Visit: Admission: RE | Admit: 2022-10-29 | Payer: BC Managed Care – PPO | Source: Ambulatory Visit

## 2023-01-12 ENCOUNTER — Encounter: Payer: Self-pay | Admitting: Family Medicine

## 2023-01-12 ENCOUNTER — Encounter: Payer: Self-pay | Admitting: Nurse Practitioner

## 2023-01-14 NOTE — Telephone Encounter (Signed)
Another encounter taken care of.

## 2023-03-19 ENCOUNTER — Other Ambulatory Visit: Payer: Self-pay

## 2023-03-19 ENCOUNTER — Encounter: Payer: Self-pay | Admitting: Family Medicine

## 2023-03-19 DIAGNOSIS — G4700 Insomnia, unspecified: Secondary | ICD-10-CM

## 2023-03-19 MED ORDER — TRAZODONE HCL 50 MG PO TABS: 50.0000 mg | ORAL_TABLET | Freq: Every evening | ORAL | 0 refills | Status: DC | PRN

## 2023-03-25 ENCOUNTER — Encounter: Payer: Self-pay | Admitting: Physician Assistant

## 2023-03-25 ENCOUNTER — Ambulatory Visit (INDEPENDENT_AMBULATORY_CARE_PROVIDER_SITE_OTHER): Payer: BC Managed Care – PPO | Admitting: Physician Assistant

## 2023-03-25 VITALS — BP 134/70 | HR 85 | Temp 98.1°F | Resp 16 | Ht 66.5 in | Wt 140.9 lb

## 2023-03-25 DIAGNOSIS — Z0000 Encounter for general adult medical examination without abnormal findings: Secondary | ICD-10-CM | POA: Diagnosis not present

## 2023-03-25 DIAGNOSIS — R5383 Other fatigue: Secondary | ICD-10-CM | POA: Diagnosis not present

## 2023-03-25 DIAGNOSIS — Z7189 Other specified counseling: Secondary | ICD-10-CM | POA: Insufficient documentation

## 2023-03-25 NOTE — Progress Notes (Signed)
Annual Physical Exam   Name: Haley Bell   MRN: [id number]    DOB: 02-29-1991   Date:03/25/2023  Today's Provider: Jacquelin Hawking, MHS, PA-C Introduced myself to the patient as a PA-C and provided education on APPs in clinical practice.         Subjective  Chief Complaint  Chief Complaint  Patient presents with   Annual Exam    HPI  Patient presents for annual CPE.  Diet: She feels like she follows a normal diet with well balanced portions Exercise: She is doing a boot camp with HIIT 3 x per week for one hour each session   Sleep:"terrible" she reports this has been ongoing, she reports sleep initiation issues. Trazodone seems to help with this a bit but she does not feel well rested. Tries to go to sleep at 9:30 and wakes up at 5 but still feels tired and sluggish every day  She has not been told she snores or has apneic events  Mood: "pretty irritable" exacerbated by fatigue   Flowsheet Row Office Visit from 03/25/2023 in [facility]  AUDIT-C Score 0      Depression: Phq 9 is  negative    03/25/2023    2:55 PM 10/24/2022    1:14 PM 06/07/2022    8:51 AM 10/18/2020    8:33 AM 09/10/2019    3:26 PM  Depression screen PHQ 2/9  Decreased Interest 0 0 2 0 0  Down, Depressed, Hopeless 0 0 1 0 0  PHQ - 2 Score 0 0 3 0 0  Altered sleeping 0 0 3  0  Tired, decreased energy 0 0 2  0  Change in appetite 0 0 0  0  Feeling bad or failure about yourself  0 0 0  0  Trouble concentrating 0 0 0  0  Moving slowly or fidgety/restless 0 0 0  0  Suicidal thoughts 0 0 0  0  PHQ-9 Score 0 0 8  0  Difficult doing work/chores Not difficult at all Not difficult at all Somewhat difficult  Not difficult at all   Hypertension: BP Readings from Last 3 Encounters:  03/25/23 134/70  10/24/22 98/62  06/07/22 120/64   Obesity: Wt Readings from Last 3 Encounters:  03/25/23 140 lb 14.4 oz (63.9 kg)  10/24/22 133 lb (60.3 kg)  06/07/22 135 lb 11.2 oz (61.6  kg)   BMI Readings from Last 3 Encounters:  03/25/23 22.40 kg/m  10/24/22 21.15 kg/m  06/07/22 21.57 kg/m     Vaccines:   HPV: discussed vaccine and offered to start 3 dose series. Tdap: UTD Shingrix: NA Pneumonia: NA Flu: pt declined today  COVID-19: Discussed vaccine and booster recommendations per available CDC guidelines     Hep C Screening: never done- added to labs today  HIV screening: completed  STD testing and prevention (HIV/chl/gon/syphilis):  Intimate partner violence: negative Sexual History: she is sexually active with single monogamous partner  Menstrual History/LMP/Abnormal Bleeding: having regular periods. LMP was 03/20/23 Discussed importance of follow up if any post-menopausal bleeding: not applicable Incontinence Symptoms: No.  Breast cancer hx: she denies known family hx- recommend routine screening at age 58  - Last Mammogram:  - BRCA gene screening:   Osteoporosis Prevention : Discussed high calcium and vitamin D supplementation, weight bearing exercises Bone density :not applicable  Cervical cancer screening: declines today- due later this year. She gets this done with OB/GYN  Skin cancer: Discussed monitoring for atypical lesions  Colorectal cancer screening: Denies known family hx - recommend routine screening at age 15    Lung cancer:  Low Dose CT Chest recommended if Age 12-80 years, 20 pack-year currently smoking OR have quit w/in 15years. Patient does not qualify.   ECG: NA  Advanced Care Planning: A voluntary discussion about advance care planning including the explanation and discussion of advance directives.  Discussed health care proxy and Living will, and the patient was able to identify a health care proxy as no one.  Patient does not have a living will in effect.  Lipids: Lab Results  Component Value Date   CHOL 157 07/20/2015   Lab Results  Component Value Date   HDL 67 07/20/2015   Lab Results  Component Value Date    LDLCALC 78 07/20/2015   Lab Results  Component Value Date   TRIG 58 07/20/2015   No results found for: "CHOLHDL" No results found for: "LDLDIRECT"  Glucose: Glucose  Date Value Ref Range Status  07/20/2015 85 65 - 99 mg/dL Final   Glucose, Bld  Date Value Ref Range Status  04/18/2018 91 65 - 139 mg/dL Final    Comment:    .        Non-fasting reference interval .    Glucose-Capillary  Date Value Ref Range Status  12/09/2020 107 (H) 70 - 99 mg/dL Final    Comment:    Glucose reference range applies only to samples taken after fasting for at least 8 hours.    Patient Active Problem List   Diagnosis Date Noted   Advanced care planning/counseling discussion 03/25/2023   Postpartum care following vaginal delivery 12/10/2020   Encounter for care or examination of lactating mother 12/10/2020   Vacuum-assisted vaginal delivery    Diet controlled gestational diabetes mellitus (GDM) in third trimester 10/24/2020   Elevated glucose tolerance test 10/05/2020   Supervision of high risk pregnancy, antepartum 04/22/2020   Fatigue 02/21/2016   Abnormal thyroid blood test 07/28/2015   GAD (generalized anxiety disorder) 07/20/2015   Insomnia 07/20/2015    Past Surgical History:  Procedure Laterality Date   NM RENAL LASIX ([facility] HX)     TONSILLECTOMY AND ADENOIDECTOMY     WISDOM TOOTH EXTRACTION      Family History  Problem Relation Age of Onset   Ovarian cysts Mother    Diabetes Maternal Grandmother    Hypertension Maternal Grandmother     Social History   Socioeconomic History   Marital status: Married    Spouse name: Shawn    Number of children: 1   Years of education: Not on file   Highest education level: Bachelor's degree (e.g., BA, AB, BS)  Occupational History   Not on file  Tobacco Use   Smoking status: Never   Smokeless tobacco: Never  Vaping Use   Vaping status: Never Used  Substance and Sexual Activity   Alcohol use: No   Drug use: No   Sexual  activity: Yes    Partners: Male    Birth control/protection: None  Other Topics Concern   Not on file  Social History Narrative   Not on file   Social Determinants of Health   Financial Resource Strain: Low Risk  (03/25/2023)   Overall Financial Resource Strain (CARDIA)    Difficulty of Paying Living Expenses: Not hard at all  Food Insecurity: No Food Insecurity (03/25/2023)   Hunger Vital Sign    Worried About Running Out  of Food in the Last Year: Never true    Ran Out of Food in the Last Year: Never true  Transportation Needs: No Transportation Needs (03/25/2023)   PRAPARE - Administrator, Civil Service (Medical): No    Lack of Transportation (Non-Medical): No  Physical Activity: Sufficiently Active (03/25/2023)   Exercise Vital Sign    Days of Exercise per Week: 3 days    Minutes of Exercise per Session: 60 min  Stress: Stress Concern Present (03/25/2023)   [employer] of Occupational Health - Occupational Stress Questionnaire    Feeling of Stress : To some extent  Social Connections: Socially Integrated (03/25/2023)   Social Connection and Isolation Panel [NHANES]    Frequency of Communication with Friends and Family: More than three times a week    Frequency of Social Gatherings with Friends and Family: More than three times a week    Attends Religious Services: More than 4 times per year    Active Member of [employer] or Organizations: Yes    Attends Engineer, structural: More than 4 times per year    Marital Status: Married  Catering manager Violence: Not At Risk (03/25/2023)   Humiliation, Afraid, Rape, and Kick questionnaire    Fear of Current or Ex-Partner: No    Emotionally Abused: No    Physically Abused: No    Sexually Abused: No     Current Outpatient Medications:    sertraline (ZOLOFT) 50 MG tablet, Take 1 tablet (50 mg total) by mouth daily., Disp: 90 tablet, Rfl: 3   traZODone (DESYREL) 50 MG tablet, Take 1-2 tablets (50-100 mg total) by  mouth at bedtime as needed for sleep., Disp: 60 tablet, Rfl: 0   busPIRone (BUSPAR) 5 MG tablet, Take 1-2 tablets (5-10 mg total) by mouth 2 (two) times daily as needed (anxiety). (Patient not taking: Reported on 03/25/2023), Disp: 180 tablet, Rfl: 2   lidocaine-prilocaine (EMLA) cream, Apply 1 application topically as needed. (Patient not taking: Reported on 03/25/2023), Disp: 30 g, Rfl: 0   metoCLOPramide (REGLAN) 10 MG tablet, Take 1 tablet (10 mg total) by mouth 3 (three) times daily before meals. (Patient not taking: Reported on 03/25/2023), Disp: 90 tablet, Rfl: 2   norethindrone (MICRONOR) 0.35 MG tablet, Take 1 tablet (0.35 mg total) by mouth daily. (Patient not taking: Reported on 03/25/2023), Disp: 28 tablet, Rfl: 11   Prenatal Vit-Fe Fumarate-FA (MULTIVITAMIN-PRENATAL) 27-0.8 MG TABS tablet, Take 1 tablet by mouth daily at 12 noon. (Patient not taking: Reported on 03/25/2023), Disp: , Rfl:   No Known Allergies   Review of Systems  Constitutional:  Positive for malaise/fatigue. Negative for chills, fever and weight loss.  HENT:  Negative for hearing loss, nosebleeds, sore throat and tinnitus.   Eyes:  Negative for blurred vision, double vision and photophobia.  Respiratory:  Negative for cough, shortness of breath and wheezing.   Cardiovascular:  Negative for chest pain, palpitations and leg swelling.  Gastrointestinal:  Negative for blood in stool, constipation, diarrhea, heartburn, nausea and vomiting.  Genitourinary:  Negative for dysuria and hematuria.  Musculoskeletal:  Negative for falls, joint pain and myalgias.  Skin:  Negative for itching and rash.  Neurological:  Positive for headaches. Negative for dizziness, tingling, tremors, loss of consciousness and weakness.  Psychiatric/Behavioral:  Negative for depression, memory loss and suicidal ideas. The patient is nervous/anxious.       Objective  Vitals:   03/25/23 1455  BP: 134/70  Pulse: 85  Resp: 16  Temp: 98.1 F  (36.7 C)  TempSrc: Oral  SpO2: 99%  Weight: 140 lb 14.4 oz (63.9 kg)  Height: 5' 6.5" (1.689 m)    Body mass index is 22.4 kg/m.  Physical Exam Vitals reviewed.  Constitutional:      General: She is awake.     Appearance: Normal appearance. She is well-developed and well-groomed.  HENT:     Head: Normocephalic and atraumatic.     Right Ear: Hearing, tympanic membrane and ear canal normal.     Left Ear: Hearing, tympanic membrane and ear canal normal.     Mouth/Throat:     Lips: Pink.     Pharynx: Uvula midline. No pharyngeal swelling, oropharyngeal exudate, posterior oropharyngeal erythema or postnasal drip.  Eyes:     General: Lids are normal. Gaze aligned appropriately.     Extraocular Movements: Extraocular movements intact.     Conjunctiva/sclera: Conjunctivae normal.     Pupils: Pupils are equal, round, and reactive to light.  Neck:     Thyroid: No thyroid mass, thyromegaly or thyroid tenderness.     Trachea: Phonation normal.  Cardiovascular:     Rate and Rhythm: Normal rate and regular rhythm.     Pulses: Normal pulses.          Radial pulses are 2+ on the right side and 2+ on the left side.     Heart sounds: Normal heart sounds. No murmur heard.    No friction rub. No gallop.  Pulmonary:     Effort: Pulmonary effort is normal.     Breath sounds: Normal breath sounds. No decreased air movement. No decreased breath sounds, wheezing, rhonchi or rales.  Abdominal:     General: Abdomen is flat. Bowel sounds are normal.     Palpations: Abdomen is soft.     Tenderness: There is no abdominal tenderness.  Musculoskeletal:     Cervical back: Normal range of motion and neck supple.     Right lower leg: No edema.     Left lower leg: No edema.  Lymphadenopathy:     Head:     Right side of head: No submental, submandibular or preauricular adenopathy.     Left side of head: No submental, submandibular or preauricular adenopathy.     Cervical:     Right cervical: No  superficial cervical adenopathy.    Left cervical: No superficial cervical adenopathy.     Upper Body:     Right upper body: No supraclavicular adenopathy.     Left upper body: No supraclavicular adenopathy.  Skin:    General: Skin is warm and dry.  Neurological:     General: No focal deficit present.     Mental Status: She is alert and oriented to person, place, and time.     GCS: GCS eye subscore is 4. GCS verbal subscore is 5. GCS motor subscore is 6.     Cranial Nerves: No cranial nerve deficit, dysarthria or facial asymmetry.     Motor: No weakness, tremor, atrophy or abnormal muscle tone.     Gait: Gait is intact.     Deep Tendon Reflexes:     Reflex Scores:      Patellar reflexes are 2+ on the right side and 2+ on the left side. Psychiatric:        Attention and Perception: Attention and perception normal.        Mood and Affect: Mood and affect normal.        Speech:  Speech normal.        Behavior: Behavior normal. Behavior is cooperative.        Thought Content: Thought content normal.        Cognition and Memory: Cognition and memory normal.        Judgment: Judgment normal.      No results found for this or any previous visit (from the past 2160 hour(s)).   Fall Risk:    03/25/2023    2:55 PM 10/24/2022    1:14 PM 06/07/2022    8:50 AM 10/31/2020    3:03 PM 10/18/2020    8:33 AM  Fall Risk   Falls in the past year? 0 0 0 0 0  Number falls in past yr: 0 0 0    Injury with Fall? 0 0 0    Risk for fall due to : No Fall Risks No Fall Risks No Fall Risks    Follow up Falls prevention discussed;Education provided;Falls evaluation completed Falls prevention discussed;Education provided;Falls evaluation completed Falls prevention discussed;Education provided;Falls evaluation completed       Functional Status Survey: Is the patient deaf or have difficulty hearing?: No Does the patient have difficulty seeing, even when wearing glasses/contacts?: No Does the patient have  difficulty concentrating, remembering, or making decisions?: No Does the patient have difficulty walking or climbing stairs?: No Does the patient have difficulty dressing or bathing?: No Does the patient have difficulty doing errands alone such as visiting a doctor's office or shopping?: No   Assessment & Plan  Problem List Items Addressed This Visit       Other   Fatigue   Relevant Orders   Vitamin D (25 hydroxy)   B12   Advanced care planning/counseling discussion    A voluntary discussion about advance care planning including the explanation and discussion of advance directives was extensively discussed  with the patient for 3 minutes with patient and myself present.  Explanation about the health care proxy and Living will was reviewed and packet with forms with explanation of how to fill them out was given.  During this discussion, the patient was able to identify a health care proxy as no one and plans fill out the paperwork required.  Patient was offered a separate Advance Care Planning visit for further assistance with forms.         Other Visit Diagnoses     Annual physical exam    -  Primary  -USPSTF grade A and B recommendations reviewed with patient; age-appropriate recommendations, preventive care, screening tests, etc discussed and encouraged; healthy living encouraged; see AVS for patient education given to patient -Discussed importance of 150 minutes of physical activity weekly, eat two servings of fish weekly, eat one serving of tree nuts ( cashews, pistachios, pecans, almonds.Marland Kitchen) every other day, eat 6 servings of fruit/vegetables daily and drink plenty of water and avoid sweet beverages.   -Reviewed Health Maintenance: Yes.     Relevant Orders   COMPLETE METABOLIC PANEL WITH GFR   CBC w/Diff/Platelet   Lipid Profile   TSH   T4   HgB A1c        No follow-ups on file.   I, Lorry Anastasi E Glenroy Crossen, PA-C, have reviewed all documentation for this visit. The documentation  on 03/25/23 for the exam, diagnosis, procedures, and orders are all accurate and complete.   Jacquelin Hawking, MHS, PA-C [facility]

## 2023-03-25 NOTE — Assessment & Plan Note (Signed)
A voluntary discussion about advance care planning including the explanation and discussion of advance directives was extensively discussed  with the patient for 3 minutes with patient and myself present.  Explanation about the health care proxy and Living will was reviewed and packet with forms with explanation of how to fill them out was given.  During this discussion, the patient was able to identify a health care proxy as no one and plans fill out the paperwork required.  Patient was offered a separate Advance Care Planning visit for further assistance with forms.

## 2023-03-26 ENCOUNTER — Encounter: Payer: Self-pay | Admitting: Physician Assistant

## 2023-03-26 LAB — CBC WITH DIFFERENTIAL/PLATELET
Absolute Monocytes: 508 {cells}/uL (ref 200–950)
Basophils Absolute: 38 {cells}/uL (ref 0–200)
Basophils Relative: 0.8 %
Eosinophils Absolute: 127 {cells}/uL (ref 15–500)
Eosinophils Relative: 2.7 %
HCT: 34.8 % — ABNORMAL LOW (ref 35.0–45.0)
Hemoglobin: 11.1 g/dL — ABNORMAL LOW (ref 11.7–15.5)
Lymphs Abs: 1598 {cells}/uL (ref 850–3900)
MCH: 32.1 pg (ref 27.0–33.0)
MCHC: 31.9 g/dL — ABNORMAL LOW (ref 32.0–36.0)
MCV: 100.6 fL — ABNORMAL HIGH (ref 80.0–100.0)
MPV: 11.7 fL (ref 7.5–12.5)
Monocytes Relative: 10.8 %
Neutro Abs: 2430 {cells}/uL (ref 1500–7800)
Neutrophils Relative %: 51.7 %
Platelets: 200 10*3/uL (ref 140–400)
RBC: 3.46 10*6/uL — ABNORMAL LOW (ref 3.80–5.10)
RDW: 11.8 % (ref 11.0–15.0)
Total Lymphocyte: 34 %
WBC: 4.7 10*3/uL (ref 3.8–10.8)

## 2023-03-26 LAB — COMPLETE METABOLIC PANEL WITH GFR
AG Ratio: 1.7 (calc) (ref 1.0–2.5)
ALT: 9 U/L (ref 6–29)
AST: 16 U/L (ref 10–30)
Albumin: 4.6 g/dL (ref 3.6–5.1)
Alkaline phosphatase (APISO): 61 U/L (ref 31–125)
BUN: 14 mg/dL (ref 7–25)
CO2: 29 mmol/L (ref 20–32)
Calcium: 9.9 mg/dL (ref 8.6–10.2)
Chloride: 105 mmol/L (ref 98–110)
Creat: 0.73 mg/dL (ref 0.50–0.97)
Globulin: 2.7 g/dL (ref 1.9–3.7)
Glucose, Bld: 102 mg/dL — ABNORMAL HIGH (ref 65–99)
Potassium: 5.1 mmol/L (ref 3.5–5.3)
Sodium: 139 mmol/L (ref 135–146)
Total Bilirubin: 0.2 mg/dL (ref 0.2–1.2)
Total Protein: 7.3 g/dL (ref 6.1–8.1)
eGFR: 113 mL/min/{1.73_m2} (ref >=60)

## 2023-03-26 LAB — HEMOGLOBIN A1C
Hgb A1c MFr Bld: 5.6 %{Hb} (ref <5.7)
Mean Plasma Glucose: 114 mg/dL
eAG (mmol/L): 6.3 mmol/L

## 2023-03-26 LAB — LIPID PANEL
Cholesterol: 170 mg/dL (ref <200)
HDL: 78 mg/dL (ref >=50)
LDL Cholesterol (Calc): 79 mg/dL
Non-HDL Cholesterol (Calc): 92 mg/dL (ref <130)
Total CHOL/HDL Ratio: 2.2 (calc) (ref <5.0)
Triglycerides: 53 mg/dL (ref <150)

## 2023-03-26 LAB — T4: T4, Total: 6 ug/dL (ref 5.1–11.9)

## 2023-03-26 LAB — TSH: TSH: 3.27 m[IU]/L

## 2023-03-26 LAB — VITAMIN D 25 HYDROXY (VIT D DEFICIENCY, FRACTURES): Vit D, 25-Hydroxy: 32 ng/mL (ref 30–100)

## 2023-03-26 LAB — VITAMIN B12: Vitamin B-12: 394 pg/mL (ref 200–1100)

## 2023-03-27 NOTE — Progress Notes (Signed)
Your lab results are back Your electrolytes, liver and kidney function were overall normal at this time Your cholesterol looks great Your thyroid testing was normal Your A1c was 5.6 which is in normal range Your vitamin D was in normal range but was still on the lower end.  I recommend supplementing with 600 to 800 units of vitamin D per day for the next 3 months and we can recheck at your next follow-up Your B12 levels were normal Your CBC did show some evidence of mild anemia.  This can sometimes be consistent with heavy menstrual periods or low iron.  If you have further questions please give the office a call and we can discuss further workup as indicated

## 2023-03-28 DIAGNOSIS — D649 Anemia, unspecified: Secondary | ICD-10-CM

## 2023-05-03 ENCOUNTER — Other Ambulatory Visit: Payer: Self-pay | Admitting: Family Medicine

## 2023-05-03 DIAGNOSIS — G4700 Insomnia, unspecified: Secondary | ICD-10-CM

## 2023-05-06 ENCOUNTER — Encounter: Payer: Self-pay | Admitting: Family Medicine

## 2023-05-06 NOTE — Telephone Encounter (Signed)
Yes maam she has a new pt appt scheduled with [facility] on tomorrow 05/07/2023

## 2023-05-06 NOTE — Telephone Encounter (Signed)
Lvm for pt to return call to possibly schedule appt here and to verify if she will be switching drs.

## 2023-05-06 NOTE — Telephone Encounter (Unsigned)
Copied from CRM [id number]. Topic: Appointment Scheduling - Scheduling Inquiry for Clinic >> May 06, 2023  1:45 PM Turkey B wrote: Reason for CRM: pt has an appt now for med refill of tramadol for Jan 2 with Dr Angelica Chessman

## 2023-05-07 ENCOUNTER — Ambulatory Visit: Payer: Self-pay | Admitting: Internal Medicine

## 2023-05-08 ENCOUNTER — Encounter: Payer: Self-pay | Admitting: Physician Assistant

## 2023-05-08 ENCOUNTER — Telehealth: Payer: BC Managed Care – PPO | Admitting: Physician Assistant

## 2023-05-08 DIAGNOSIS — G4700 Insomnia, unspecified: Secondary | ICD-10-CM | POA: Diagnosis not present

## 2023-05-08 MED ORDER — TRAZODONE HCL 50 MG PO TABS: 50.0000 mg | ORAL_TABLET | Freq: Every evening | ORAL | 3 refills | Status: DC | PRN

## 2023-05-08 NOTE — Telephone Encounter (Signed)
Pt already connected with provider

## 2023-05-08 NOTE — Assessment & Plan Note (Signed)
Chronic, historic condition  Appears well-managed on current regimen comprised of trazodone 25 to 50 mg p.o. nightly as needed.  Patient occasionally takes 1.5 tablets for more resistant insomnia symptoms She reports good relief with current regimen and states that she usually gets restful sleep when she is taking this Refills provided today Continue current regimen Follow-up as needed for progressing or persistent symptoms or in 6 months

## 2023-05-08 NOTE — Progress Notes (Signed)
   Virtual Visit via Video Note  I connected with Haley Bell on 05/08/23 at 10:40 AM EST by a video enabled telemedicine application and verified that I am speaking with the correct person using two identifiers.  Today's Provider: Jacquelin Hawking, MHS, PA-C Introduced myself to the patient as a PA-C and provided education on APPs in clinical practice.   Location: Patient: at home  Provider: [facility], [place], [place]    I discussed the limitations of evaluation and management by telemedicine and the availability of in person appointments. The patient expressed understanding and agreed to proceed.  Chief Complaint  Patient presents with   Medication Refill    History of Present Illness:   Insomnia  Currently managed with Trazodone but she has been out  She reports fatigue as she did not sleep at all last night   She reports racing thoughts that prevent sleep She states she takes Trazodone about 30 minutes prior to bed time and this helps calm her thoughts She takes half to 1.5 tablets at this time, she rarely uses 2 whole tablets  She is not breastfeeding She reports mild anxiety during the day but she thinks this is well managed with her Sertraline  She reports 60 quantity usually lasts about a month and half   Observations/Objective:  Due to the nature of the virtual visit, physical exam and observations are limited. Able to obtain the following observations:   Alert, oriented, x3 Appears comfortable, in no acute distress.  No scleral injection, no appreciated hoarseness, tachypnea, wheeze or strider. Able to maintain conversation without visible strain.  No cough appreciated during visit.    Assessment and Plan:  Problem List Items Addressed This Visit       Other   Insomnia - Primary    Chronic, historic condition  Appears well-managed on current regimen comprised of trazodone 25 to 50 mg p.o. nightly as needed.  Patient occasionally takes 1.5  tablets for more resistant insomnia symptoms She reports good relief with current regimen and states that she usually gets restful sleep when she is taking this Refills provided today Continue current regimen Follow-up as needed for progressing or persistent symptoms or in 6 months      Relevant Medications   traZODone (DESYREL) 50 MG tablet     Return in about 6 months (around 11/05/2023) for anxiety, insomnia.   I, Lamesha Tibbits E Damyn Weitzel, PA-C, have reviewed all documentation for this visit. The documentation on 05/08/23 for the exam, diagnosis, procedures, and orders are all accurate and complete.   Jacquelin Hawking, MHS, PA-C [facility]    Follow Up Instructions:    I discussed the assessment and treatment plan with the patient. The patient was provided an opportunity to ask questions and all were answered. The patient agreed with the plan and demonstrated an understanding of the instructions.   The patient was advised to call back or seek an in-person evaluation if the symptoms worsen or if the condition fails to improve as anticipated.  I provided 8 minutes of non-face-to-face time during this encounter.   Shareka Casale E Edge Mauger, PA-C

## 2023-06-10 ENCOUNTER — Other Ambulatory Visit: Payer: Self-pay | Admitting: Family Medicine

## 2023-06-10 DIAGNOSIS — F411 Generalized anxiety disorder: Secondary | ICD-10-CM

## 2023-06-20 ENCOUNTER — Telehealth: Payer: 59 | Admitting: Physician Assistant

## 2023-06-20 ENCOUNTER — Encounter: Payer: Self-pay | Admitting: Physician Assistant

## 2023-06-20 DIAGNOSIS — F411 Generalized anxiety disorder: Secondary | ICD-10-CM

## 2023-06-20 DIAGNOSIS — G4700 Insomnia, unspecified: Secondary | ICD-10-CM

## 2023-06-20 MED ORDER — SERTRALINE HCL 50 MG PO TABS: 50.0000 mg | ORAL_TABLET | Freq: Every day | ORAL | 0 refills | Status: DC

## 2023-06-20 NOTE — Assessment & Plan Note (Addendum)
 Chronic, historic condition, stable  She is taking Trazodone  50 mg PO at bedtime  She reports this is stable for now and would like to continue with current regimen  She is good on refills for now  Follow up in about 4 months or sooner if concerns arise

## 2023-06-20 NOTE — Progress Notes (Signed)
 Virtual Visit via Video Note  I connected with Haley Bell on 06/20/23 at 10:00 AM EST by a video enabled telemedicine application and verified that I am speaking with the correct person using two identifiers.  Today's Provider: [place], MHS, PA-C Introduced myself to the patient as a PA-C and provided education on APPs in clinical practice.   Location: Patient: at home  Provider: [facility], [place], [place]    I discussed the limitations of evaluation and management by telemedicine and the availability of in person appointments. The patient expressed understanding and agreed to proceed.  Chief Complaint  Patient presents with   Insomnia    Follow up       History of Present Illness:  Discussed the use of AI scribe software for clinical note transcription with the patient, who gave verbal consent to proceed.  History of Present Illness   The patient, with a history of heavy menstrual periods and low iron, had a virtual appointment to discuss recent lab results. They reported no new health concerns. The patient's recent blood work showed low vitamin D  levels and a low red blood cell count, which could be consistent with their heavy menstrual periods or low iron. The patient is currently on Trazodone , with no reported side effects or mood changes. They also mentioned a medication refill requirement after their current bottle is finished.        Observations/Objective:  Due to the nature of the virtual visit, physical exam and observations are limited. Able to obtain the following observations:   Alert, oriented, Appears comfortable, in no acute distress.  No scleral injection, no appreciated hoarseness, tachypnea, wheeze or strider. Able to maintain conversation without visible strain.  No cough appreciated during visit.    Assessment and Plan: Problem List Items Addressed This Visit       Other   GAD (generalized anxiety disorder)   Chronic,  ongoing  Reports she is satisfied with Sertraline  50 mg PO every day and would like refills today Refills provided today  Follow up in 3 months or sooner if concerns arise        Relevant Medications   sertraline  (ZOLOFT ) 50 MG tablet   Insomnia - Primary   Chronic, historic condition, stable  She is taking Trazodone  50 mg PO at bedtime  She reports this is stable for now and would like to continue with current regimen  She is good on refills for now  Follow up in about 4 months or sooner if concerns arise        Assessment and Plan    Vitamin D  Deficiency Low vitamin D  levels potentially linked to heavy menstrual periods or low iron. No side effects from current medications. - Repeat vitamin D  and CBC in three months  General Health Maintenance Electrolytes, cholesterol, and thyroid levels within normal limits. No new issues identified. - Continue current health maintenance practices  Follow-up - Schedule follow-up appointment in April - Send prescription refill to [facility] [address].        Follow Up Instructions:    I discussed the assessment and treatment plan with the patient. The patient was provided an opportunity to ask questions and all were answered. The patient agreed with the plan and demonstrated an understanding of the instructions.   The patient was advised to call back or seek an in-person evaluation if the symptoms worsen or if the condition fails to improve as anticipated.  I provided 7 minutes  of non-face-to-face time during this encounter.  No follow-ups on file.   I, Danyella Mcginty E Suzana Sohail, PA-C, have reviewed all documentation for this visit. The documentation on 06/20/23 for the exam, diagnosis, procedures, and orders are all accurate and complete.   [place], MHS, PA-C [facility]

## 2023-06-20 NOTE — Assessment & Plan Note (Signed)
 Chronic, ongoing  Reports she is satisfied with Sertraline 50 mg PO every day and would like refills today Refills provided today  Follow up in 3 months or sooner if concerns arise

## 2023-08-15 ENCOUNTER — Encounter: Payer: Self-pay | Admitting: Physician Assistant

## 2023-08-15 ENCOUNTER — Other Ambulatory Visit: Payer: Self-pay

## 2023-08-15 DIAGNOSIS — R5383 Other fatigue: Secondary | ICD-10-CM

## 2023-08-15 DIAGNOSIS — D649 Anemia, unspecified: Secondary | ICD-10-CM

## 2023-09-06 ENCOUNTER — Other Ambulatory Visit: Payer: Self-pay | Admitting: Physician Assistant

## 2023-09-06 DIAGNOSIS — F411 Generalized anxiety disorder: Secondary | ICD-10-CM

## 2023-09-06 NOTE — Telephone Encounter (Signed)
 Requested Prescriptions  Pending Prescriptions Disp Refills   sertraline (ZOLOFT) 50 MG tablet [Pharmacy Med Name: Sertraline HCl 50 MG Oral Tablet] 90 tablet 0    Sig: Take 1 tablet by mouth once daily     Psychiatry:  Antidepressants - SSRI - sertraline Passed - 09/06/2023  4:59 PM      Passed - AST in normal range and within 360 days    AST  Date Value Ref Range Status  03/25/2023 16 10 - 30 U/L Final         Passed - ALT in normal range and within 360 days    ALT  Date Value Ref Range Status  03/25/2023 9 6 - 29 U/L Final         Passed - Completed PHQ-2 or PHQ-9 in the last 360 days      Passed - Valid encounter within last 6 months    Recent Outpatient Visits           2 months ago Insomnia, unspecified type   [facility] Mecum, Oswaldo Conroy, PA-C   4 months ago Insomnia, unspecified type   [facility] Mecum, Oswaldo Conroy, PA-C   5 months ago Annual physical exam   [facility] Mecum, Oswaldo Conroy, PA-C   10 months ago Mass of right side of neck   [facility] Danelle Berry, PA-C   1 year ago Erroneous encounter - disregard   [facility] Berniece Salines, [place]

## 2023-09-09 ENCOUNTER — Other Ambulatory Visit: Payer: Self-pay | Admitting: Physician Assistant

## 2023-09-09 DIAGNOSIS — G4700 Insomnia, unspecified: Secondary | ICD-10-CM

## 2023-09-10 NOTE — Telephone Encounter (Signed)
 Requested medication (s) are due for refill today: yes   Requested medication (s) are on the active medication list: yes   Last refill:  05/08/23 #60 3 refills  Future visit scheduled: no   Notes to clinic:  last ordered by E. Mecum, PA. Do you want to refill Rx?     Requested Prescriptions  Pending Prescriptions Disp Refills   traZODone (DESYREL) 50 MG tablet [Pharmacy Med Name: traZODone HCl 50 MG Oral Tablet] 60 tablet 0    Sig: TAKE 1 TO 2 TABLETS BY MOUTH AT BEDTIME AS NEEDED FOR SLEEP     Psychiatry: Antidepressants - Serotonin Modulator Passed - 09/10/2023  1:57 PM      Passed - Valid encounter within last 6 months    Recent Outpatient Visits           2 months ago Insomnia, unspecified type   [facility] Mecum, Oswaldo Conroy, PA-C   4 months ago Insomnia, unspecified type   [facility] Mecum, Oswaldo Conroy, PA-C   5 months ago Annual physical exam   [facility] Mecum, Oswaldo Conroy, PA-C   10 months ago Mass of right side of neck   [facility] Danelle Berry, PA-C   1 year ago Erroneous encounter - disregard   [facility] Berniece Salines, [place]

## 2023-09-11 NOTE — Telephone Encounter (Signed)
 Lvm letting pt know prescription has been sent to pharmacy and for her to return call to schedule future appt

## 2023-12-09 ENCOUNTER — Other Ambulatory Visit: Payer: Self-pay | Admitting: Family Medicine

## 2023-12-09 DIAGNOSIS — F411 Generalized anxiety disorder: Secondary | ICD-10-CM

## 2023-12-10 NOTE — Telephone Encounter (Signed)
 Refused Zoloft  50 mg because pt has established care with new provider on 09/23/2023.  Dr. Reyes Costa at [facility].

## 2023-12-24 ENCOUNTER — Encounter: Payer: Self-pay | Admitting: Family Medicine

## 2023-12-24 ENCOUNTER — Encounter: Payer: Self-pay | Admitting: Physician Assistant
